# Patient Record
Sex: Female | Born: 1935 | Race: White | Hispanic: No | Marital: Married | Smoking: Never smoker
Health system: Southern US, Community
[De-identification: ages and names within clinical notes are randomized; demographics above are authoritative.]

## PROBLEM LIST (undated history)

## (undated) DIAGNOSIS — R079 Chest pain, unspecified: Secondary | ICD-10-CM

## (undated) DIAGNOSIS — R0609 Other forms of dyspnea: Secondary | ICD-10-CM

## (undated) DIAGNOSIS — M4804 Spinal stenosis, thoracic region: Secondary | ICD-10-CM

## (undated) DIAGNOSIS — M4714 Other spondylosis with myelopathy, thoracic region: Secondary | ICD-10-CM

## (undated) DIAGNOSIS — Z8701 Personal history of pneumonia (recurrent): Secondary | ICD-10-CM

## (undated) DIAGNOSIS — I639 Cerebral infarction, unspecified: Secondary | ICD-10-CM

## (undated) DIAGNOSIS — R0600 Dyspnea, unspecified: Secondary | ICD-10-CM

## (undated) DIAGNOSIS — E785 Hyperlipidemia, unspecified: Secondary | ICD-10-CM

## (undated) DIAGNOSIS — I350 Nonrheumatic aortic (valve) stenosis: Secondary | ICD-10-CM

## (undated) DIAGNOSIS — N189 Chronic kidney disease, unspecified: Secondary | ICD-10-CM

## (undated) DIAGNOSIS — I10 Essential (primary) hypertension: Secondary | ICD-10-CM

## (undated) DIAGNOSIS — N200 Calculus of kidney: Secondary | ICD-10-CM

## (undated) HISTORY — DX: Hyperlipidemia, unspecified: E78.5

## (undated) HISTORY — PX: CATARACT EXTRACTION, BILATERAL: SHX1313

## (undated) HISTORY — DX: Nonrheumatic aortic (valve) stenosis: I35.0

## (undated) HISTORY — PX: TONSILLECTOMY AND ADENOIDECTOMY: SUR1326

## (undated) HISTORY — PX: ENDOMETRIAL ABLATION: SHX621

## (undated) HISTORY — PX: APPENDECTOMY: SHX54

## (undated) HISTORY — DX: Calculus of kidney: N20.0

## (undated) HISTORY — PX: THORACIC FUSION: SHX1062

---

## 1996-02-15 DIAGNOSIS — I639 Cerebral infarction, unspecified: Secondary | ICD-10-CM

## 1996-02-15 HISTORY — DX: Cerebral infarction, unspecified: I63.9

## 1997-07-08 ENCOUNTER — Other Ambulatory Visit: Admission: RE | Admit: 1997-07-08 | Discharge: 1997-07-08 | Payer: Self-pay | Admitting: *Deleted

## 1997-08-26 ENCOUNTER — Ambulatory Visit (HOSPITAL_COMMUNITY): Admission: RE | Admit: 1997-08-26 | Discharge: 1997-08-26 | Payer: Self-pay | Admitting: Obstetrics and Gynecology

## 1997-08-27 ENCOUNTER — Other Ambulatory Visit: Admission: RE | Admit: 1997-08-27 | Discharge: 1997-08-27 | Payer: Self-pay | Admitting: *Deleted

## 1998-05-13 ENCOUNTER — Other Ambulatory Visit: Admission: RE | Admit: 1998-05-13 | Discharge: 1998-05-13 | Payer: Self-pay | Admitting: Obstetrics and Gynecology

## 1999-06-03 ENCOUNTER — Other Ambulatory Visit: Admission: RE | Admit: 1999-06-03 | Discharge: 1999-06-03 | Payer: Self-pay | Admitting: Obstetrics and Gynecology

## 1999-07-20 ENCOUNTER — Ambulatory Visit (HOSPITAL_COMMUNITY): Admission: RE | Admit: 1999-07-20 | Discharge: 1999-07-20 | Payer: Self-pay | Admitting: Obstetrics and Gynecology

## 1999-08-03 ENCOUNTER — Ambulatory Visit (HOSPITAL_COMMUNITY): Admission: RE | Admit: 1999-08-03 | Discharge: 1999-08-03 | Payer: Self-pay | Admitting: Gastroenterology

## 2000-04-27 ENCOUNTER — Ambulatory Visit (HOSPITAL_COMMUNITY): Admission: RE | Admit: 2000-04-27 | Discharge: 2000-04-27 | Payer: Self-pay | Admitting: Neurosurgery

## 2000-04-27 ENCOUNTER — Encounter: Payer: Self-pay | Admitting: Neurosurgery

## 2000-05-11 ENCOUNTER — Encounter: Payer: Self-pay | Admitting: Neurosurgery

## 2000-05-11 ENCOUNTER — Ambulatory Visit (HOSPITAL_COMMUNITY): Admission: RE | Admit: 2000-05-11 | Discharge: 2000-05-11 | Payer: Self-pay | Admitting: Neurosurgery

## 2000-05-24 ENCOUNTER — Encounter: Payer: Self-pay | Admitting: Neurosurgery

## 2000-05-24 ENCOUNTER — Encounter: Admission: RE | Admit: 2000-05-24 | Discharge: 2000-05-24 | Payer: Self-pay | Admitting: Neurosurgery

## 2000-07-03 ENCOUNTER — Other Ambulatory Visit: Admission: RE | Admit: 2000-07-03 | Discharge: 2000-07-03 | Payer: Self-pay | Admitting: Obstetrics and Gynecology

## 2000-10-23 ENCOUNTER — Ambulatory Visit (HOSPITAL_COMMUNITY): Admission: RE | Admit: 2000-10-23 | Discharge: 2000-10-23 | Payer: Self-pay | Admitting: Neurosurgery

## 2000-10-31 ENCOUNTER — Encounter: Payer: Self-pay | Admitting: Neurosurgery

## 2000-11-02 ENCOUNTER — Encounter: Payer: Self-pay | Admitting: Neurosurgery

## 2000-11-02 ENCOUNTER — Inpatient Hospital Stay (HOSPITAL_COMMUNITY): Admission: RE | Admit: 2000-11-02 | Discharge: 2000-11-07 | Payer: Self-pay | Admitting: Neurosurgery

## 2000-12-20 ENCOUNTER — Ambulatory Visit (HOSPITAL_COMMUNITY): Admission: RE | Admit: 2000-12-20 | Discharge: 2000-12-20 | Payer: Self-pay | Admitting: Neurosurgery

## 2001-01-05 ENCOUNTER — Inpatient Hospital Stay (HOSPITAL_COMMUNITY): Admission: RE | Admit: 2001-01-05 | Discharge: 2001-01-06 | Payer: Self-pay | Admitting: Neurosurgery

## 2001-01-05 ENCOUNTER — Encounter: Payer: Self-pay | Admitting: Neurosurgery

## 2001-02-05 ENCOUNTER — Encounter: Admission: RE | Admit: 2001-02-05 | Discharge: 2001-02-05 | Payer: Self-pay | Admitting: Neurosurgery

## 2001-02-05 ENCOUNTER — Encounter: Payer: Self-pay | Admitting: Neurosurgery

## 2001-05-01 ENCOUNTER — Encounter: Payer: Self-pay | Admitting: Neurosurgery

## 2001-05-01 ENCOUNTER — Encounter: Admission: RE | Admit: 2001-05-01 | Discharge: 2001-05-01 | Payer: Self-pay | Admitting: Neurosurgery

## 2001-08-28 ENCOUNTER — Encounter: Payer: Self-pay | Admitting: Neurosurgery

## 2001-08-28 ENCOUNTER — Ambulatory Visit (HOSPITAL_COMMUNITY): Admission: RE | Admit: 2001-08-28 | Discharge: 2001-08-28 | Payer: Self-pay | Admitting: Neurosurgery

## 2001-10-08 ENCOUNTER — Inpatient Hospital Stay (HOSPITAL_COMMUNITY): Admission: RE | Admit: 2001-10-08 | Discharge: 2001-10-13 | Payer: Self-pay | Admitting: Neurosurgery

## 2001-10-08 ENCOUNTER — Encounter: Payer: Self-pay | Admitting: Neurosurgery

## 2001-10-09 ENCOUNTER — Encounter: Payer: Self-pay | Admitting: Neurosurgery

## 2002-12-26 ENCOUNTER — Other Ambulatory Visit: Admission: RE | Admit: 2002-12-26 | Discharge: 2002-12-26 | Payer: Self-pay | Admitting: Internal Medicine

## 2004-03-30 ENCOUNTER — Ambulatory Visit (HOSPITAL_COMMUNITY): Admission: RE | Admit: 2004-03-30 | Discharge: 2004-03-30 | Payer: Self-pay | Admitting: Internal Medicine

## 2005-02-01 ENCOUNTER — Ambulatory Visit (HOSPITAL_COMMUNITY): Admission: RE | Admit: 2005-02-01 | Discharge: 2005-02-01 | Payer: Self-pay | Admitting: Gastroenterology

## 2005-03-24 ENCOUNTER — Other Ambulatory Visit: Admission: RE | Admit: 2005-03-24 | Discharge: 2005-03-24 | Payer: Self-pay | Admitting: Internal Medicine

## 2005-07-05 ENCOUNTER — Ambulatory Visit (HOSPITAL_COMMUNITY): Admission: RE | Admit: 2005-07-05 | Discharge: 2005-07-05 | Payer: Self-pay | Admitting: Neurosurgery

## 2005-07-25 ENCOUNTER — Ambulatory Visit (HOSPITAL_COMMUNITY): Admission: RE | Admit: 2005-07-25 | Discharge: 2005-07-25 | Payer: Self-pay | Admitting: Neurosurgery

## 2005-08-03 ENCOUNTER — Ambulatory Visit: Admission: RE | Admit: 2005-08-03 | Discharge: 2005-08-03 | Payer: Self-pay | Admitting: Neurosurgery

## 2005-08-10 ENCOUNTER — Ambulatory Visit (HOSPITAL_COMMUNITY): Admission: RE | Admit: 2005-08-10 | Discharge: 2005-08-10 | Payer: Self-pay | Admitting: Neurosurgery

## 2005-09-01 ENCOUNTER — Inpatient Hospital Stay (HOSPITAL_COMMUNITY): Admission: RE | Admit: 2005-09-01 | Discharge: 2005-09-06 | Payer: Self-pay | Admitting: Neurosurgery

## 2008-04-06 IMAGING — CT CT L SPINE W/O CM
3 series · 16 of 33 positions shown, 19 images · IV contrast (agent unspecified)
Comparison: none

CLINICAL DATA: Back pain, History of lumbar fusion with laminectomy.
LUMBAR SPINE CT WITHOUT CONTRAST:
TECHNIQUE: Multidetector CT imaging of the lumbar spine was performed.  Multiplanar CT image reconstructions were also generated.

[Series 4: 2mm axial soft tissue · axial · 0.39mm/px · z∈[+786,+1028]mm · 8 of 143 slices shown, 10 images]
[im 11/143  soft-tissue]
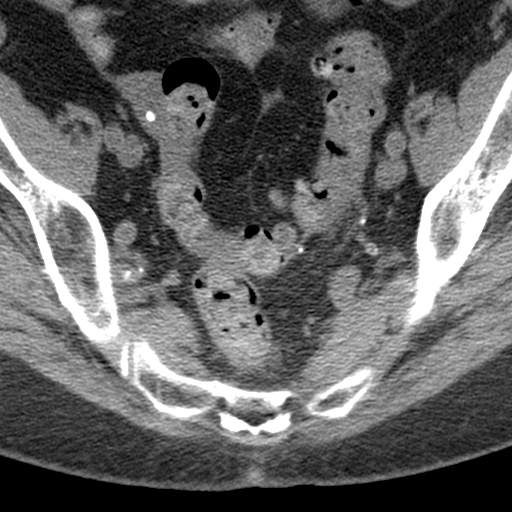
[im 11/143  bone]
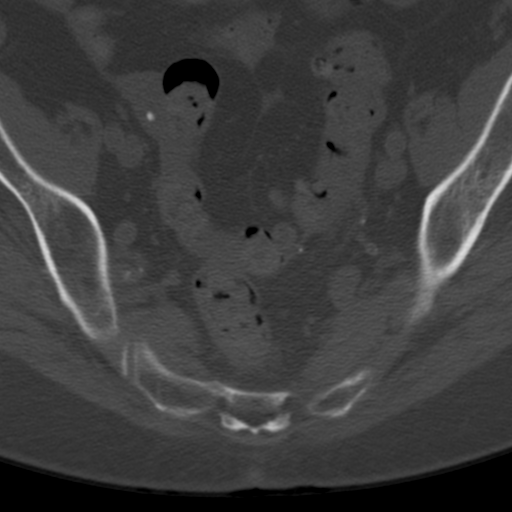
[im 33/143  bone]
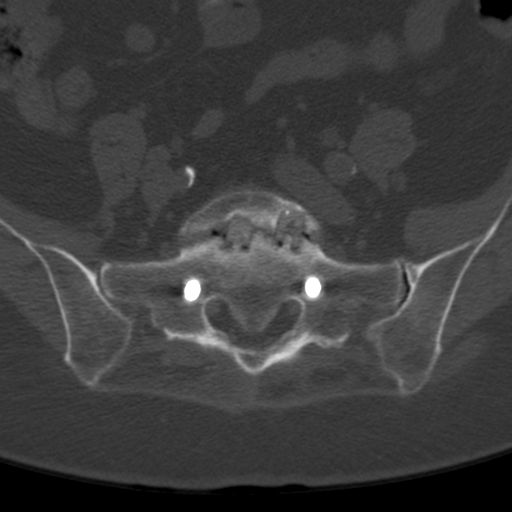
[im 44/143  bone]
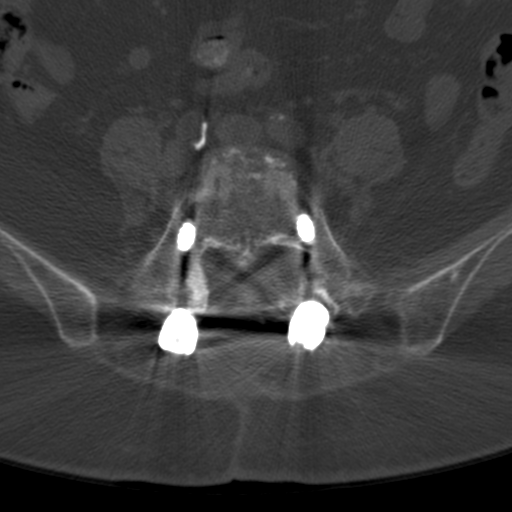
[im 66/143  bone]
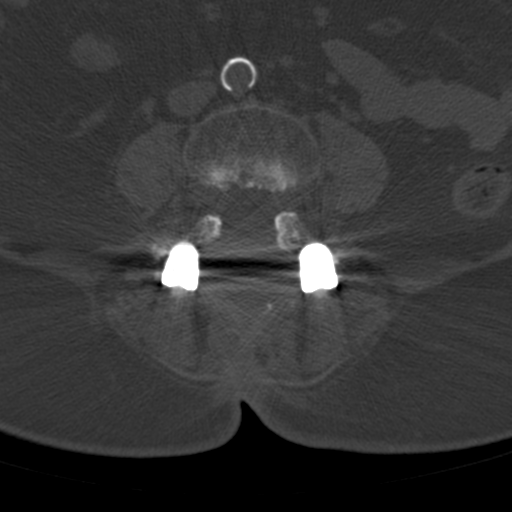
[im 77/143  soft-tissue]
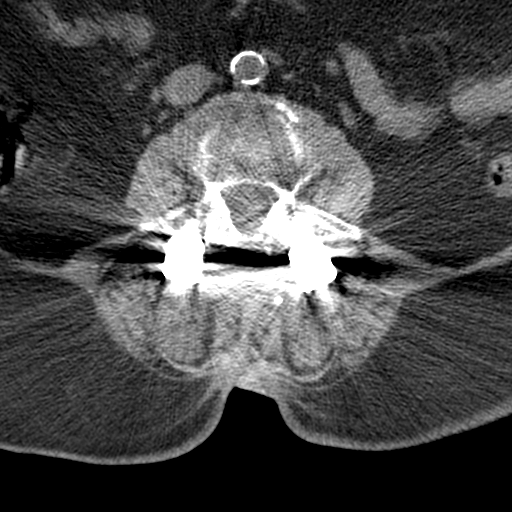
[im 77/143  bone]
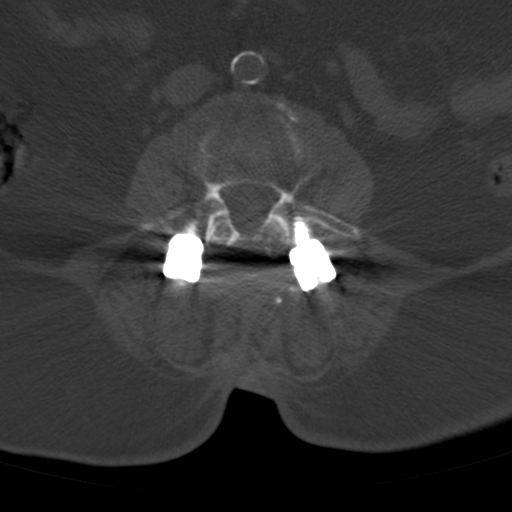
[im 99/143  bone]
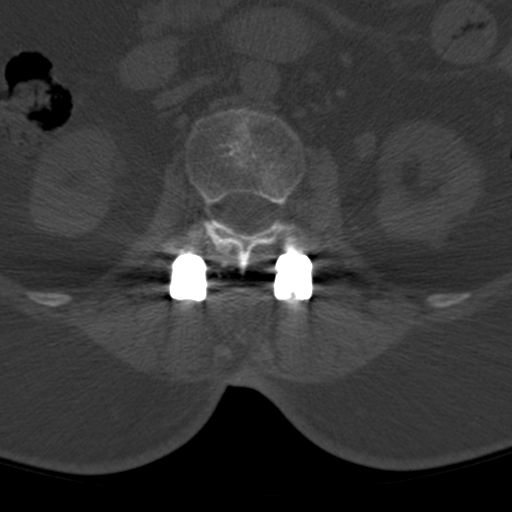
[im 110/143  bone]
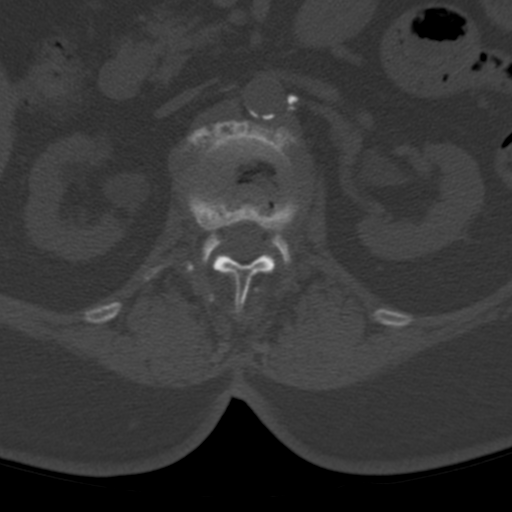
[im 132/143  bone]
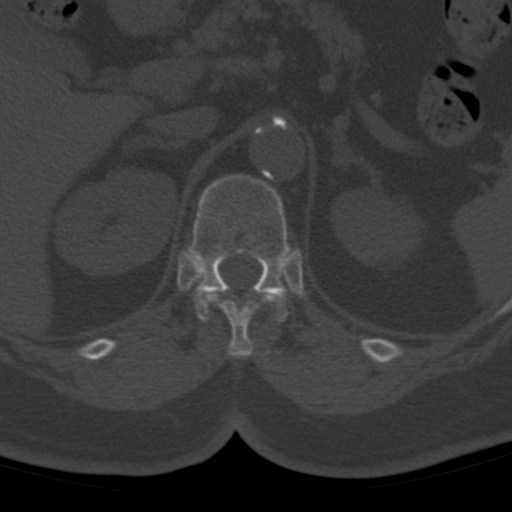

[Series 602: coronals · coronal · 0.56mm/px · 3 of 71 slices shown]
[im 15/71  bone]
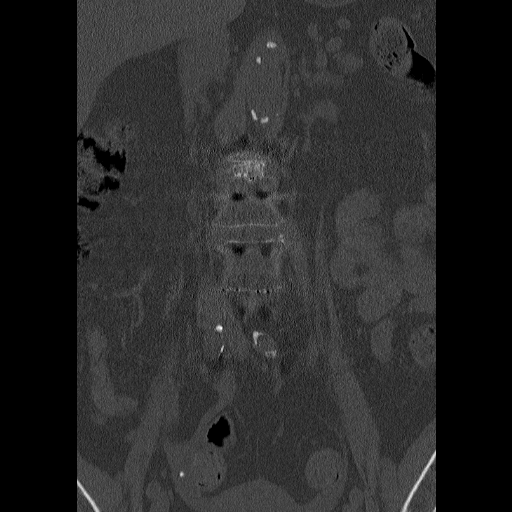
[im 29/71  bone]
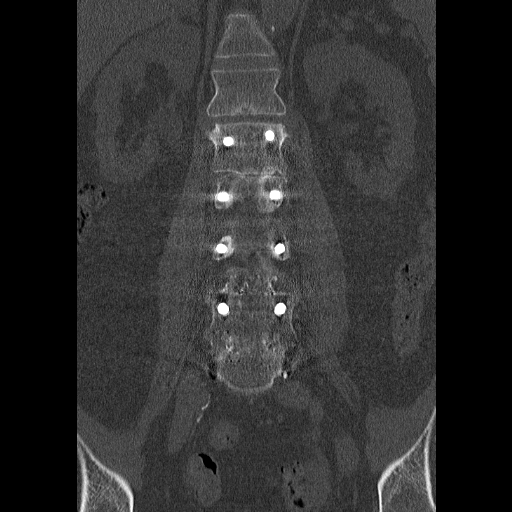
[im 43/71  bone]
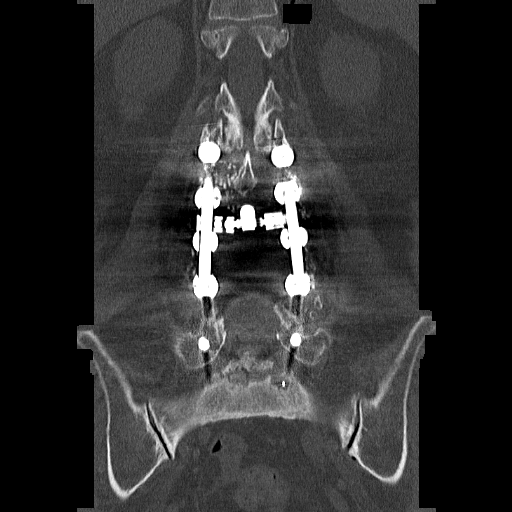

[Series 603: sagittals · sagittal · 0.56mm/px · 5 of 71 slices shown, 6 images]
[im 24/71  bone]
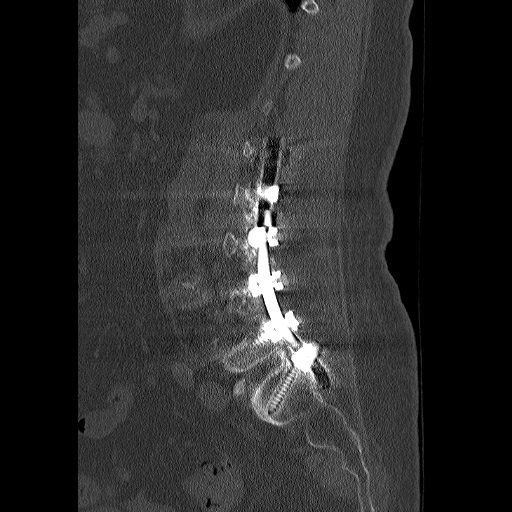
[im 30/71  bone]
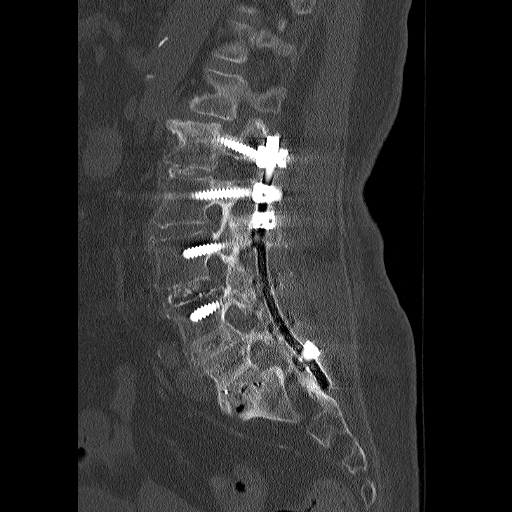
[im 36/71  soft-tissue]
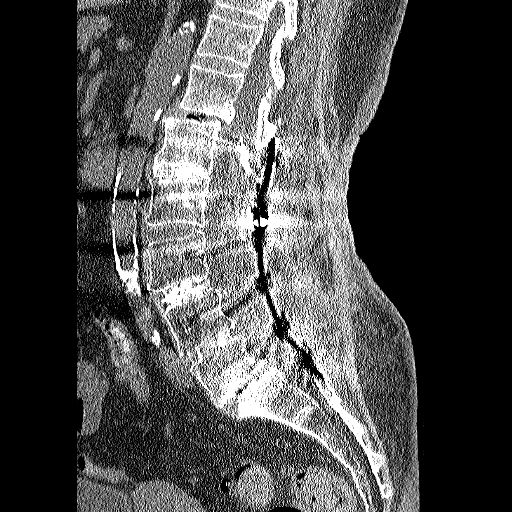
[im 36/71  bone]
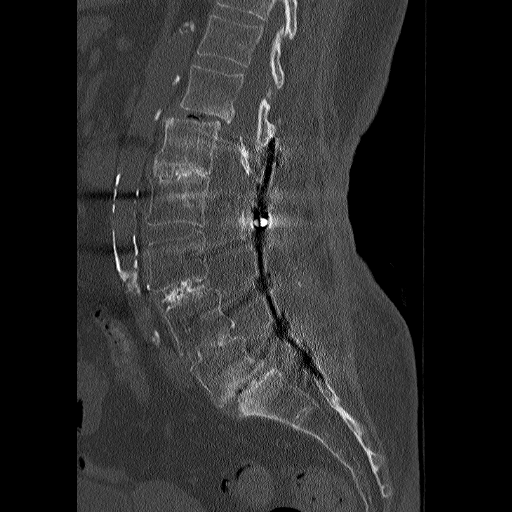
[im 41/71  bone]
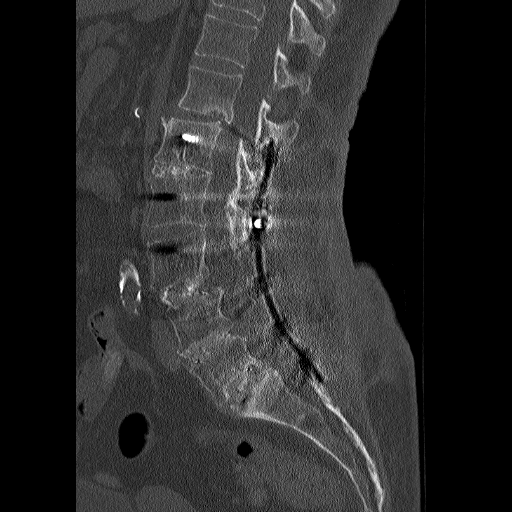
[im 47/71  bone]
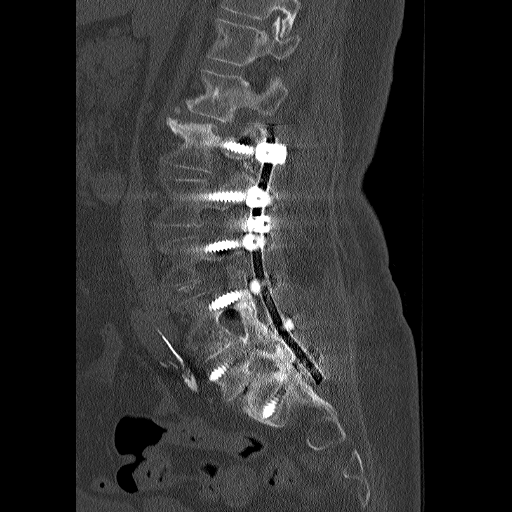

[16 of 33 positions shown; findings below may reference images not displayed]

FINDINGS: Scans were obtained without IV contrast from the T10-11 level through the L5-S1 level. Sagittal and coronal reformations were performed.  The study is correlated with an MR of the lumbar spine dated 07/05/05.
IMPRESSION: There has been previous wide posterior laminectomy from the L-1 level inferiorly through the L5-S1 level.  There are pedicle screws noted bilaterally from the L-1 through S-1 levels. The pedicle screws appear intact and in satisfactory position and the posterior rods and hardware appear intact.  There has been interbody fusions performed at the L1-2, L3-4, L4-5 and L5-S1 levels.  There is no interbody fusion at the L2-3 level.  There are streak artifacts related to the metal. However, the fusion appears stable.  There is retrolisthesis of T-12 on L-1 which measures 5 mm by CT.  There is a vacuum phenomenon present at this level and there are bilateral facet joint arthritic changes noted at this level.  Although the thecal sac is not well evaluated on this CT scan, the previous MR did demonstrate canal narrowing with effacement of the subarachnoid space.  Also the posterior fluid collection described on the MR scan extending from L-3 through L-5 is not well evaluated by CT due to the extreme streak artifacts.  There are no bony destructive changes.
IMPRESSION: Bilateral laminectomy from the L1 through S-1 levels with posterior rods and pedicle screws with intact hardware and stable appearance of the fusion. There is marked degenerative disk space narrowing at the T12-L1 level with 5 mm retrolisthesis of T-12 on L-1.

## 2010-02-14 HISTORY — PX: LITHOTRIPSY: SUR834

## 2010-08-06 ENCOUNTER — Encounter (HOSPITAL_COMMUNITY): Payer: Self-pay

## 2010-08-06 ENCOUNTER — Emergency Department (HOSPITAL_COMMUNITY)
Admission: EM | Admit: 2010-08-06 | Discharge: 2010-08-06 | Disposition: A | Discharge status: Home / Self Care | Payer: Medicare Other | Attending: Emergency Medicine | Admitting: Emergency Medicine

## 2010-08-06 ENCOUNTER — Emergency Department (HOSPITAL_COMMUNITY): Payer: Medicare Other

## 2010-08-06 DIAGNOSIS — R1031 Right lower quadrant pain: Secondary | ICD-10-CM | POA: Insufficient documentation

## 2010-08-06 DIAGNOSIS — R109 Unspecified abdominal pain: Secondary | ICD-10-CM | POA: Insufficient documentation

## 2010-08-06 DIAGNOSIS — I10 Essential (primary) hypertension: Secondary | ICD-10-CM | POA: Insufficient documentation

## 2010-08-06 DIAGNOSIS — N200 Calculus of kidney: Secondary | ICD-10-CM | POA: Insufficient documentation

## 2010-08-06 LAB — URINALYSIS, ROUTINE W REFLEX MICROSCOPIC
Bilirubin Urine: NEGATIVE
Glucose, UA: NEGATIVE mg/dL
Ketones, ur: NEGATIVE mg/dL
Nitrite: NEGATIVE
Protein, ur: NEGATIVE mg/dL
Specific Gravity, Urine: 1.019 (ref 1.005–1.030)
Urobilinogen, UA: 0.2 mg/dL (ref 0.0–1.0)

## 2010-08-06 LAB — DIFFERENTIAL
Basophils Relative: 0 % (ref 0–1)
Eosinophils Absolute: 0.3 10*3/uL (ref 0.0–0.7)
Eosinophils Relative: 3 % (ref 0–5)
Monocytes Absolute: 1 10*3/uL (ref 0.1–1.0)
Monocytes Relative: 11 % (ref 3–12)
Neutro Abs: 6.2 10*3/uL (ref 1.7–7.7)

## 2010-08-06 LAB — CBC
MCH: 28.5 pg (ref 26.0–34.0)
MCHC: 34 g/dL (ref 30.0–36.0)
Platelets: 202 10*3/uL (ref 150–400)

## 2010-08-06 LAB — POCT I-STAT, CHEM 8
Chloride: 108 mEq/L (ref 96–112)
Glucose, Bld: 120 mg/dL — ABNORMAL HIGH (ref 70–99)
Hemoglobin: 12.2 g/dL (ref 12.0–15.0)
Potassium: 4 mEq/L (ref 3.5–5.1)
Sodium: 137 mEq/L (ref 135–145)

## 2010-08-06 LAB — URINE MICROSCOPIC-ADD ON

## 2010-08-10 ENCOUNTER — Ambulatory Visit (HOSPITAL_BASED_OUTPATIENT_CLINIC_OR_DEPARTMENT_OTHER)
Admission: RE | Admit: 2010-08-10 | Discharge: 2010-08-10 | Disposition: A | Discharge status: Home / Self Care | Payer: Medicare Other | Source: Ambulatory Visit | Attending: Urology | Admitting: Urology

## 2010-08-10 DIAGNOSIS — N201 Calculus of ureter: Secondary | ICD-10-CM | POA: Insufficient documentation

## 2010-08-10 DIAGNOSIS — Z01810 Encounter for preprocedural cardiovascular examination: Secondary | ICD-10-CM | POA: Insufficient documentation

## 2010-08-10 DIAGNOSIS — R9431 Abnormal electrocardiogram [ECG] [EKG]: Secondary | ICD-10-CM | POA: Insufficient documentation

## 2010-08-10 DIAGNOSIS — Z01812 Encounter for preprocedural laboratory examination: Secondary | ICD-10-CM | POA: Insufficient documentation

## 2010-08-10 LAB — POCT I-STAT, CHEM 8: HCT: 35 % — ABNORMAL LOW (ref 36.0–46.0)

## 2010-08-11 LAB — URINE CULTURE
Culture  Setup Time: 201206262110
Culture: NO GROWTH

## 2010-09-09 NOTE — Op Note (Signed)
NAMELEOMA, FOLDS           ACCOUNT NO.:  [id number]  MEDICAL RECORD NO.:  [id number]  LOCATION:                                 FACILITY:  PHYSICIAN:  Danae Chen, M.D.  DATE OF BIRTH:  1924/04/12  DATE OF PROCEDURE:  08/10/2010 DATE OF DISCHARGE:                              OPERATIVE REPORT   PREOPERATIVE DIAGNOSIS:  Right ureteral stone.  POSTOPERATIVE DIAGNOSIS:  Right ureteral stone.  PROCEDURE DONE:  Cystoscopy, right retrograde pyelogram, ureteroscopy, holmium laser right ureteral stone, stone extraction, and insertion of double-J stent.  SURGEON:  Danae Chen, MD  ANESTHESIA:  General.  INDICATION:  The patient is an 77 year old female who has been complaining of right flank and right lower quadrant pain on and off for the past 2 weeks.  She was at first treated by her primary care physician for urinary tract infection and pain became severe and a CT scan showed a 6 mm stone in the right distal ureter.  She was treated conservatively with medical expulsive therapy, she has not passed the stone, and she is still complaining of pain.  She is scheduled today for cystoscopy, right retrograde pyelogram, ureteroscopy, holmium laser of ureteral stone, and insertion of double-J stent.  The patient was identified by her wristband and proper time-out was taken.  Under general anesthesia, she was prepped and draped and placed in the dorsal lithotomy position.  A panendoscope was inserted in the bladder. The bladder mucosa was normal.  There was no stone or tumor in the bladder.  The ureteral orifices were in normal position and shape.  Retrograde pyelogram.  A sensor wire was passed through an open-ended catheter and the sensor wire and open-ended catheter were passed through the cystoscope and through the right ureteral orifice.  The sensor wire was removed.  Contrast was then injected through the open-ended catheter.  About 10 cc of contrast were instilled  through the open-ended catheter.  There was a filling defect in the distal ureter about 2 cm above the ureteral orifice.  The ureter proximal to the filling defect was moderately dilated.  The sensor wire was then passed through the open-ended catheter and advanced up to the renal pelvis.  The sensor wire was left in place as a safety wire.  The open-ended catheter was removed.  A semi-rigid ureteroscope was then passed in the bladder, but could not be passed through the ureteral orifice.  The ureteroscope was removed. The intramural ureter was then dilated with the ureteroscope access sheath and the ureteroscope access sheath was removed.  A semi-rigid ureteroscope was then passed without difficulty through the right ureteral orifice.  The stone was seen in the distal ureter.  With the holmium laser at a setting of 0.5 joules and 22 shocks, the stone was fragmented in multiple smaller fragments.  The stone fragments were removed with a nitinol basket and dropped in the bladder.  There was no remaining stone fragment in the ureter at the end of the procedure.  Second retrograde pyelogram.  Contrast was then injected through the ureteroscope and there was no evidence of filling defect in the ureter and there was no extravasation of contrast.  The ureteroscope  was removed.  The sensor wire was then back loaded into the cystoscope.  A #[id number] double-J stent was passed over the guidewire and the guidewire was removed.  The proximal curl of the double-J stent was in the renal pelvis, the distal curl was in the bladder.  Then, the stone fragments were irrigated out of the bladder with the [facility] syringe. There was no remaining fragment in the bladder.  The bladder was then emptied and the cystoscope removed.  The patient tolerated the procedure well and left the OR in satisfactory condition to post-anesthesia care unit.     Danae Chen, M.D.     MN/MEDQ  D:  08/10/2010  T:   08/10/2010  Job:  [id number]  Electronically Signed by Lindaann Slough M.D. on 09/09/2010 05:52:44 PM

## 2010-11-08 ENCOUNTER — Encounter (HOSPITAL_BASED_OUTPATIENT_CLINIC_OR_DEPARTMENT_OTHER)
Admission: RE | Admit: 2010-11-08 | Discharge: 2010-11-08 | Disposition: A | Discharge status: Home / Self Care | Payer: Medicare Other | Source: Ambulatory Visit | Attending: Orthopedic Surgery | Admitting: Orthopedic Surgery

## 2010-11-08 LAB — BASIC METABOLIC PANEL
BUN: 22 mg/dL (ref 6–23)
Calcium: 9.6 mg/dL (ref 8.4–10.5)
Chloride: 106 mEq/L (ref 96–112)
Creatinine, Ser: 1.02 mg/dL (ref 0.50–1.10)
Potassium: 4.1 mEq/L (ref 3.5–5.1)

## 2010-11-09 ENCOUNTER — Ambulatory Visit (HOSPITAL_BASED_OUTPATIENT_CLINIC_OR_DEPARTMENT_OTHER)
Admission: RE | Admit: 2010-11-09 | Discharge: 2010-11-10 | Disposition: A | Discharge status: Home / Self Care | Payer: Medicare Other | Source: Ambulatory Visit | Attending: Orthopedic Surgery | Admitting: Orthopedic Surgery

## 2010-11-09 DIAGNOSIS — I10 Essential (primary) hypertension: Secondary | ICD-10-CM | POA: Insufficient documentation

## 2010-11-09 DIAGNOSIS — W19XXXA Unspecified fall, initial encounter: Secondary | ICD-10-CM | POA: Insufficient documentation

## 2010-11-09 DIAGNOSIS — Y929 Unspecified place or not applicable: Secondary | ICD-10-CM | POA: Insufficient documentation

## 2010-11-09 DIAGNOSIS — S52599A Other fractures of lower end of unspecified radius, initial encounter for closed fracture: Secondary | ICD-10-CM | POA: Insufficient documentation

## 2010-11-09 DIAGNOSIS — Z01812 Encounter for preprocedural laboratory examination: Secondary | ICD-10-CM | POA: Insufficient documentation

## 2010-11-09 LAB — POCT HEMOGLOBIN-HEMACUE: Hemoglobin: 11.9 g/dL — ABNORMAL LOW (ref 12.0–15.0)

## 2010-11-11 NOTE — Op Note (Signed)
[name], [name]           ACCOUNT NO.:  [id number]  MEDICAL RECORD NO.:  [id number]  LOCATION:                                 FACILITY:  PHYSICIAN:  Suzanne Fitch. Hannia Matchett, M.D. DATE OF BIRTH:  Jul 18, 1924  DATE OF PROCEDURE:  11/09/2010 DATE OF DISCHARGE:                              OPERATIVE REPORT   PREOPERATIVE DIAGNOSIS:  Chauffeur's type fracture, right radius, with uneven scaphoid facet of distal radius, rule out lesser arc injury surrounding perilunate articulation.  POSTOPERATIVE DIAGNOSIS:  Unstable chauffeur's fracture, right distal radius, with possible scapholunate ligament partial injury.  OPERATION:  Open reduction and internal fixation of chauffeur's type fracture of right distal radius utilizing a 6-peg DVR volar plate system with fluoroscopic evaluation of wrist demonstrating some widening of the scapholunate interosseous space.  OPERATING SURGEON:  Suzanne Fitch. Vihana Kydd, MD  ASSISTANT:  Marveen Reeks Dasnoit, PA-C  ANESTHESIA:  General by LMA.  SUPERVISING ANESTHESIOLOGIST:  Quita Skye. 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  She sustained a displaced articular fracture of the radius that extended into the styloid and scaphoid facet and might be involved with a lesser arc injury of the perilunate joints.  Suzanne Howard was seen at the [facility] where she was splinted and referred for orthopedic followup.  Clinical examination revealed an unstable chauffeur's type fracture.  There was some widening of the scapholunate interval.  We advised her to proceed with open reduction and internal fixation of the fracture utilizing the volar plate system, and we will examine her scapholunate ligament at the time of surgery.  At age 78, we will not entertain repair of the scapholunate ligament.  Questions regarding her surgery were invited and answered in detail preoperatively.  She was  interviewed by Dr. Krista Blue of Anesthesia.  After informed consent, Dr. Krista Blue placed an ultrasound-guided plexus block. Satisfactory anesthesia of the right upper extremity was achieved.  PROCEDURE:  Suzanne Howard was brought to room 6 of the [facility] and placed in a supine position upon the operating table.  Following the induction of general anesthesia by LMA technique, her arm was prepped with Betadine soap and solution and sterilely draped.  Per protocol, 2 g of Ancef were administered as IV prophylactic antibiotic.  Procedure commenced with a routine surgical time-out followed by planning of a standard DVR volar incision.  The incision was taken sharply followed by electrocautery of subcutaneous veins.  The flexor carpi radialis tendon was identified, fascia released superficial and deep to the tendon followed by elevation and ulnar retraction of the flexor pollicis longus and insertion of the pronator quadratus.  The pronator quadratus was elevated revealing the displaced fracture site.  There was a Laurence Compton type fracture equivalent with palmar displacement of the articular surface of the distal radius. There was an unstable step off the volar cortex.  The fracture was molded with three-point manual molding, reduced anatomically followed by meticulous application of 6-peg DVR plate system.  Suzanne Howard had an atypical shape to her radius that required meticulous bending of the plate with plate benders to accommodate the shape of volar radius.  Ultimately, an excellent  fit was achieved.  The plate was placed with a 14, 12, and 12 mm 3.5 cortical screw set followed by placement of 6 distal pegs controlling length fluoroscopically and by direct palpation of the dorsal cortex.  Anatomic reduction of the radius was achieved.  Examination of the scapholunate interval revealed widening.  There was no frank instability of the scaphoid with wrist motion; therefore, no  further intervention will be entertained at the carpal level.  The wound was thoroughly irrigated with sterile saline followed by repair of the distal pronator quadratus to cover the plate and protect the flexor tendons.  The wound was irrigated a second time followed by repair of the skin with subcutaneous sutures of 3-0 Vicryl and intradermal 3-0 Prolene segmental suture limbs.  Steri-Strips were applied followed by application of a voluminous gauze and Webril dressing.  A very light sugar-tong splint was applied for postoperative comfort.  For aftercare, Suzanne Howard is provided prescriptions for Percocet 5 mg one p.o. q.4-6 h. p.r.n. pain, 30 tablets without refill, also Keflex 500 mg one p.o. q.8 h. x4 days as a prophylactic antibiotic.  We will see her back in followup in our office in a week for dressing change, x-ray, application of a Velcro splint, and initiation of range of motion excises to the wrist, fingers, and thumb.     Suzanne Howard, M.D.     RVS/MEDQ  D:  11/09/2010  T:  11/09/2010  Job:  [id number]  Electronically Signed by Josephine Igo M.D. on 11/11/2010 08:16:36 AM

## 2011-02-12 ENCOUNTER — Ambulatory Visit (INDEPENDENT_AMBULATORY_CARE_PROVIDER_SITE_OTHER): Payer: Medicare Other

## 2011-02-12 DIAGNOSIS — E538 Deficiency of other specified B group vitamins: Secondary | ICD-10-CM

## 2011-02-12 DIAGNOSIS — R040 Epistaxis: Secondary | ICD-10-CM

## 2011-02-12 DIAGNOSIS — R042 Hemoptysis: Secondary | ICD-10-CM

## 2011-02-12 DIAGNOSIS — J301 Allergic rhinitis due to pollen: Secondary | ICD-10-CM

## 2011-02-12 DIAGNOSIS — J029 Acute pharyngitis, unspecified: Secondary | ICD-10-CM

## 2011-02-12 DIAGNOSIS — J019 Acute sinusitis, unspecified: Secondary | ICD-10-CM

## 2011-04-07 ENCOUNTER — Other Ambulatory Visit: Payer: Self-pay | Admitting: Neurosurgery

## 2011-04-07 DIAGNOSIS — M545 Low back pain: Secondary | ICD-10-CM

## 2011-04-07 DIAGNOSIS — M546 Pain in thoracic spine: Secondary | ICD-10-CM

## 2011-04-07 DIAGNOSIS — IMO0002 Reserved for concepts with insufficient information to code with codable children: Secondary | ICD-10-CM

## 2011-04-12 ENCOUNTER — Other Ambulatory Visit: Payer: Medicare Other

## 2011-04-14 ENCOUNTER — Ambulatory Visit
Admission: RE | Admit: 2011-04-14 | Discharge: 2011-04-14 | Disposition: A | Discharge status: Home / Self Care | Payer: Medicare Other | Source: Ambulatory Visit | Attending: Neurosurgery | Admitting: Neurosurgery

## 2011-04-14 DIAGNOSIS — IMO0002 Reserved for concepts with insufficient information to code with codable children: Secondary | ICD-10-CM

## 2011-04-14 DIAGNOSIS — M546 Pain in thoracic spine: Secondary | ICD-10-CM

## 2011-04-14 DIAGNOSIS — M545 Low back pain: Secondary | ICD-10-CM

## 2011-04-14 MED ORDER — GADOBENATE DIMEGLUMINE 529 MG/ML IV SOLN
7.0000 mL | Freq: Once | INTRAVENOUS | Status: AC | PRN
  Administered 2011-04-14: 7 mL via INTRAVENOUS

## 2011-04-26 ENCOUNTER — Other Ambulatory Visit: Payer: Self-pay | Admitting: Neurosurgery

## 2011-04-27 ENCOUNTER — Encounter (HOSPITAL_COMMUNITY): Payer: Self-pay | Admitting: Pharmacy Technician

## 2011-04-29 ENCOUNTER — Encounter (HOSPITAL_COMMUNITY)
Admission: RE | Admit: 2011-04-29 | Discharge: 2011-04-29 | Disposition: A | Discharge status: Home / Self Care | Payer: Medicare Other | Source: Ambulatory Visit | Attending: Neurosurgery | Admitting: Neurosurgery

## 2011-04-29 ENCOUNTER — Encounter (HOSPITAL_COMMUNITY): Payer: Self-pay

## 2011-04-29 ENCOUNTER — Ambulatory Visit (HOSPITAL_COMMUNITY)
Admission: RE | Admit: 2011-04-29 | Discharge: 2011-04-29 | Disposition: A | Discharge status: Home / Self Care | Payer: Medicare Other | Source: Ambulatory Visit | Attending: Anesthesiology | Admitting: Anesthesiology

## 2011-04-29 DIAGNOSIS — Z01818 Encounter for other preprocedural examination: Secondary | ICD-10-CM | POA: Insufficient documentation

## 2011-04-29 HISTORY — DX: Spinal stenosis, thoracic region: M48.04

## 2011-04-29 HISTORY — DX: Essential (primary) hypertension: I10

## 2011-04-29 HISTORY — DX: Other spondylosis with myelopathy, thoracic region: M47.14

## 2011-04-29 HISTORY — DX: Cerebral infarction, unspecified: I63.9

## 2011-04-29 HISTORY — DX: Personal history of pneumonia (recurrent): Z87.01

## 2011-04-29 LAB — BASIC METABOLIC PANEL
CO2: 25 mEq/L (ref 19–32)
Calcium: 9.4 mg/dL (ref 8.4–10.5)
Chloride: 103 mEq/L (ref 96–112)
Glucose, Bld: 102 mg/dL — ABNORMAL HIGH (ref 70–99)
Potassium: 4.1 mEq/L (ref 3.5–5.1)
Sodium: 139 mEq/L (ref 135–145)

## 2011-04-29 LAB — DIFFERENTIAL
Eosinophils Absolute: 0.2 10*3/uL (ref 0.0–0.7)
Eosinophils Relative: 3 % (ref 0–5)
Lymphocytes Relative: 34 % (ref 12–46)
Lymphs Abs: 1.9 10*3/uL (ref 0.7–4.0)
Monocytes Absolute: 0.7 10*3/uL (ref 0.1–1.0)
Monocytes Relative: 13 % — ABNORMAL HIGH (ref 3–12)

## 2011-04-29 LAB — CBC
HCT: 39.2 % (ref 36.0–46.0)
Hemoglobin: 12.8 g/dL (ref 12.0–15.0)
MCH: 27.5 pg (ref 26.0–34.0)
MCV: 84.3 fL (ref 78.0–100.0)
RBC: 4.65 MIL/uL (ref 3.87–5.11)
WBC: 5.5 10*3/uL (ref 4.0–10.5)

## 2011-04-29 LAB — TYPE AND SCREEN

## 2011-04-29 LAB — URINALYSIS, ROUTINE W REFLEX MICROSCOPIC
Glucose, UA: NEGATIVE mg/dL
Hgb urine dipstick: NEGATIVE
Ketones, ur: NEGATIVE mg/dL
Protein, ur: NEGATIVE mg/dL
pH: 5.5 (ref 5.0–8.0)

## 2011-04-29 LAB — SURGICAL PCR SCREEN: Staphylococcus aureus: NEGATIVE

## 2011-04-29 LAB — URINE MICROSCOPIC-ADD ON

## 2011-04-29 NOTE — Pre-Procedure Instructions (Signed)
20 JAMYE BALICKI  04/29/2011   Your procedure is scheduled on:  May 03, 2011  Report to [facility] Stay [facility] at 0530 AM.  Call this number if you have problems the morning of surgery: [phone]   Remember:   Do not eat food:After Midnight.  May have clear liquids: up to 4 Hours before arrival.  Clear liquids include soda, tea, black coffee, apple or grape juice, broth.  Take these medicines the morning of surgery with A SIP OF WATER: Metoprolol   Stop Plavix today, CO Q 10, Ibuprofen, naproxen  Do not wear jewelry, make-up or nail polish.  Do not wear lotions, powders, or perfumes. You may wear deodorant.  Do not shave 48 hours prior to surgery.  Do not bring valuables to the hospital.  Contacts, dentures or bridgework may not be worn into surgery.  Leave suitcase in the car. After surgery it may be brought to your room.  For patients admitted to the hospital, checkout time is 11:00 AM the day of discharge.   Patients discharged the day of surgery will not be allowed to drive home.    Special Instructions: CHG Shower Use Special Wash: 1/2 bottle night before surgery and 1/2 bottle morning of surgery.   Please read over the following fact sheets that you were given: Pain Booklet, Coughing and Deep Breathing, MRSA Information and Surgical Site Infection Prevention

## 2011-05-02 MED ORDER — CEFAZOLIN SODIUM 1-5 GM-% IV SOLN: 1.0000 g | INTRAVENOUS | Status: DC

## 2011-05-02 MED ORDER — CEFAZOLIN SODIUM-DEXTROSE 2-3 GM-% IV SOLR
2.0000 g | INTRAVENOUS | Status: AC
  Administered 2011-05-03: 2 g via INTRAVENOUS
  Filled 2011-05-02: qty 50

## 2011-05-03 ENCOUNTER — Encounter (HOSPITAL_COMMUNITY): Payer: Self-pay | Admitting: Anesthesiology

## 2011-05-03 ENCOUNTER — Encounter (HOSPITAL_COMMUNITY): Payer: Self-pay | Admitting: General Practice

## 2011-05-03 ENCOUNTER — Encounter (HOSPITAL_COMMUNITY): Payer: Self-pay | Admitting: Surgery

## 2011-05-03 ENCOUNTER — Inpatient Hospital Stay (HOSPITAL_COMMUNITY): Payer: Medicare Other

## 2011-05-03 ENCOUNTER — Encounter (HOSPITAL_COMMUNITY)
Admission: RE | Disposition: A | Discharge status: Home Health | Payer: Self-pay | Source: Ambulatory Visit | Attending: Neurosurgery

## 2011-05-03 ENCOUNTER — Inpatient Hospital Stay (HOSPITAL_COMMUNITY): Payer: Medicare Other | Admitting: Anesthesiology

## 2011-05-03 ENCOUNTER — Inpatient Hospital Stay (HOSPITAL_COMMUNITY)
Admission: RE | Admit: 2011-05-03 | Discharge: 2011-05-09 | DRG: 460 | Disposition: A | Discharge status: Home Health | Payer: Medicare Other | Source: Ambulatory Visit | Attending: Neurosurgery | Admitting: Neurosurgery

## 2011-05-03 DIAGNOSIS — Z8673 Personal history of transient ischemic attack (TIA), and cerebral infarction without residual deficits: Secondary | ICD-10-CM

## 2011-05-03 DIAGNOSIS — Z01812 Encounter for preprocedural laboratory examination: Secondary | ICD-10-CM

## 2011-05-03 DIAGNOSIS — Z981 Arthrodesis status: Secondary | ICD-10-CM

## 2011-05-03 DIAGNOSIS — Y831 Surgical operation with implant of artificial internal device as the cause of abnormal reaction of the patient, or of later complication, without mention of misadventure at the time of the procedure: Secondary | ICD-10-CM | POA: Diagnosis present

## 2011-05-03 DIAGNOSIS — T84498A Other mechanical complication of other internal orthopedic devices, implants and grafts, initial encounter: Secondary | ICD-10-CM | POA: Diagnosis present

## 2011-05-03 DIAGNOSIS — M4714 Other spondylosis with myelopathy, thoracic region: Principal | ICD-10-CM | POA: Diagnosis present

## 2011-05-03 DIAGNOSIS — I10 Essential (primary) hypertension: Secondary | ICD-10-CM | POA: Diagnosis present

## 2011-05-03 LAB — CBC
HCT: 27.7 % — ABNORMAL LOW (ref 36.0–46.0)
MCH: 27.3 pg (ref 26.0–34.0)
MCV: 81.2 fL (ref 78.0–100.0)
RBC: 3.41 MIL/uL — ABNORMAL LOW (ref 3.87–5.11)
WBC: 9.2 10*3/uL (ref 4.0–10.5)

## 2011-05-03 LAB — APTT: aPTT: 30 seconds (ref 24–37)

## 2011-05-03 SURGERY — POSTERIOR LUMBAR FUSION 3 LEVEL
Anesthesia: General | Site: Spine Thoracic | Wound class: Clean

## 2011-05-03 MED ORDER — METOPROLOL SUCCINATE ER 50 MG PO TB24
50.0000 mg | ORAL_TABLET | Freq: Every day | ORAL | Status: DC
  Administered 2011-05-03 – 2011-05-09 (×7): 50 mg via ORAL
  Filled 2011-05-03 (×9): qty 1

## 2011-05-03 MED ORDER — ACETAMINOPHEN 650 MG RE SUPP: 650.0000 mg | RECTAL | Status: DC | PRN

## 2011-05-03 MED ORDER — PROMETHAZINE HCL 25 MG/ML IJ SOLN
12.5000 mg | INTRAMUSCULAR | Status: DC | PRN
  Filled 2011-05-03: qty 1

## 2011-05-03 MED ORDER — SODIUM CHLORIDE 0.9 % IV SOLN
INTRAVENOUS | Status: AC
  Filled 2011-05-03: qty 500

## 2011-05-03 MED ORDER — LIDOCAINE-EPINEPHRINE 1 %-1:100000 IJ SOLN
INTRAMUSCULAR | Status: DC | PRN
  Administered 2011-05-03: 20 mL

## 2011-05-03 MED ORDER — SODIUM CHLORIDE 0.9 % IR SOLN
Status: DC | PRN
  Administered 2011-05-03: 09:00:00

## 2011-05-03 MED ORDER — SODIUM CHLORIDE 0.9 % IV SOLN: 250.0000 mL | INTRAVENOUS | Status: DC

## 2011-05-03 MED ORDER — BACITRACIN 50000 UNITS IM SOLR
INTRAMUSCULAR | Status: AC
  Filled 2011-05-03: qty 1

## 2011-05-03 MED ORDER — VECURONIUM BROMIDE 10 MG IV SOLR
INTRAVENOUS | Status: DC | PRN
  Administered 2011-05-03 (×2): 2 mg via INTRAVENOUS

## 2011-05-03 MED ORDER — VITAMIN B-12 100 MCG PO TABS
200.0000 ug | ORAL_TABLET | Freq: Every day | ORAL | Status: DC
  Administered 2011-05-03 – 2011-05-09 (×7): 200 ug via ORAL
  Filled 2011-05-03 (×8): qty 2

## 2011-05-03 MED ORDER — MAGNESIUM HYDROXIDE 400 MG/5ML PO SUSP: 30.0000 mL | Freq: Every day | ORAL | Status: DC | PRN

## 2011-05-03 MED ORDER — WHITE PETROLATUM GEL
Status: AC
  Filled 2011-05-03: qty 5

## 2011-05-03 MED ORDER — SODIUM CHLORIDE 0.9 % IJ SOLN: 9.0000 mL | INTRAMUSCULAR | Status: DC | PRN

## 2011-05-03 MED ORDER — CYCLOBENZAPRINE HCL 10 MG PO TABS
10.0000 mg | ORAL_TABLET | Freq: Three times a day (TID) | ORAL | Status: DC | PRN
  Administered 2011-05-07 – 2011-05-08 (×2): 10 mg via ORAL
  Filled 2011-05-03 (×2): qty 1

## 2011-05-03 MED ORDER — KCL IN DEXTROSE-NACL 20-5-0.45 MEQ/L-%-% IV SOLN
INTRAVENOUS | Status: DC
  Administered 2011-05-03 – 2011-05-05 (×4): via INTRAVENOUS
  Filled 2011-05-03 (×11): qty 1000

## 2011-05-03 MED ORDER — FENTANYL CITRATE 0.05 MG/ML IJ SOLN
INTRAMUSCULAR | Status: DC | PRN
  Administered 2011-05-03: 50 ug via INTRAVENOUS
  Administered 2011-05-03: 100 ug via INTRAVENOUS
  Administered 2011-05-03: 50 ug via INTRAVENOUS
  Administered 2011-05-03: 150 ug via INTRAVENOUS
  Administered 2011-05-03: 50 ug via INTRAVENOUS

## 2011-05-03 MED ORDER — KETOROLAC TROMETHAMINE 30 MG/ML IJ SOLN
30.0000 mg | Freq: Four times a day (QID) | INTRAMUSCULAR | Status: AC
  Administered 2011-05-03 (×3): 30 mg via INTRAVENOUS
  Filled 2011-05-03 (×3): qty 1

## 2011-05-03 MED ORDER — ALBUMIN HUMAN 5 % IV SOLN
INTRAVENOUS | Status: DC | PRN
  Administered 2011-05-03 (×2): via INTRAVENOUS

## 2011-05-03 MED ORDER — MORPHINE SULFATE (PF) 1 MG/ML IV SOLN
INTRAVENOUS | Status: DC
Start: 2011-05-03 — End: 2011-05-05
  Administered 2011-05-03 (×2): 3 mg via INTRAVENOUS
  Administered 2011-05-04: 1 mg via INTRAVENOUS
  Administered 2011-05-04: 6 mg via INTRAVENOUS
  Administered 2011-05-04: 0.1 mg via INTRAVENOUS
  Administered 2011-05-04: 3 mg via INTRAVENOUS
  Administered 2011-05-05: 2 mg via INTRAVENOUS
  Administered 2011-05-05: 4 mg via INTRAVENOUS

## 2011-05-03 MED ORDER — ACETAMINOPHEN 325 MG PO TABS: 650.0000 mg | ORAL_TABLET | ORAL | Status: DC | PRN

## 2011-05-03 MED ORDER — SODIUM CHLORIDE 0.9 % IJ SOLN: 3.0000 mL | INTRAMUSCULAR | Status: DC | PRN

## 2011-05-03 MED ORDER — ONDANSETRON HCL 4 MG/2ML IJ SOLN: 4.0000 mg | Freq: Once | INTRAMUSCULAR | Status: DC | PRN

## 2011-05-03 MED ORDER — CEFAZOLIN SODIUM 1-5 GM-% IV SOLN
1.0000 g | Freq: Three times a day (TID) | INTRAVENOUS | Status: AC
  Administered 2011-05-03 – 2011-05-05 (×6): 1 g via INTRAVENOUS
  Filled 2011-05-03 (×7): qty 50

## 2011-05-03 MED ORDER — KETOROLAC TROMETHAMINE 30 MG/ML IJ SOLN
INTRAMUSCULAR | Status: AC
  Filled 2011-05-03: qty 1

## 2011-05-03 MED ORDER — EPHEDRINE SULFATE 50 MG/ML IJ SOLN
INTRAMUSCULAR | Status: DC | PRN
  Administered 2011-05-03 (×2): 5 mg via INTRAVENOUS

## 2011-05-03 MED ORDER — ONDANSETRON HCL 4 MG/2ML IJ SOLN: 4.0000 mg | INTRAMUSCULAR | Status: DC | PRN

## 2011-05-03 MED ORDER — SODIUM CHLORIDE 0.9 % IJ SOLN
3.0000 mL | Freq: Two times a day (BID) | INTRAMUSCULAR | Status: DC
  Administered 2011-05-04 – 2011-05-05 (×2): 3 mL via INTRAVENOUS

## 2011-05-03 MED ORDER — 0.9 % SODIUM CHLORIDE (POUR BTL) OPTIME
TOPICAL | Status: DC | PRN
  Administered 2011-05-03: 1000 mL

## 2011-05-03 MED ORDER — DOCUSATE SODIUM 100 MG PO CAPS
100.0000 mg | ORAL_CAPSULE | Freq: Two times a day (BID) | ORAL | Status: DC
  Administered 2011-05-03 – 2011-05-09 (×13): 100 mg via ORAL
  Filled 2011-05-03 (×11): qty 1

## 2011-05-03 MED ORDER — NEOSTIGMINE METHYLSULFATE 1 MG/ML IJ SOLN
INTRAMUSCULAR | Status: DC | PRN
  Administered 2011-05-03: 5 mg via INTRAVENOUS

## 2011-05-03 MED ORDER — MORPHINE SULFATE (PF) 1 MG/ML IV SOLN
INTRAVENOUS | Status: AC
  Administered 2011-05-03: 13:00:00
  Filled 2011-05-03: qty 25

## 2011-05-03 MED ORDER — MENTHOL 3 MG MT LOZG: 1.0000 | LOZENGE | OROMUCOSAL | Status: DC | PRN

## 2011-05-03 MED ORDER — SODIUM CHLORIDE 0.9 % IR SOLN: Status: DC | PRN

## 2011-05-03 MED ORDER — BACITRACIN ZINC 500 UNIT/GM EX OINT
TOPICAL_OINTMENT | CUTANEOUS | Status: DC | PRN
  Administered 2011-05-03: 1 via TOPICAL

## 2011-05-03 MED ORDER — ONDANSETRON HCL 4 MG/2ML IJ SOLN: 4.0000 mg | Freq: Four times a day (QID) | INTRAMUSCULAR | Status: DC | PRN

## 2011-05-03 MED ORDER — DEXAMETHASONE SODIUM PHOSPHATE 4 MG/ML IJ SOLN
INTRAMUSCULAR | Status: DC | PRN
  Administered 2011-05-03: 10 mg via INTRAVENOUS

## 2011-05-03 MED ORDER — ONDANSETRON HCL 4 MG/2ML IJ SOLN
INTRAMUSCULAR | Status: DC | PRN
  Administered 2011-05-03: 4 mg via INTRAVENOUS

## 2011-05-03 MED ORDER — PROPOFOL 10 MG/ML IV BOLUS
INTRAVENOUS | Status: DC | PRN
  Administered 2011-05-03: 150 mg via INTRAVENOUS

## 2011-05-03 MED ORDER — ACETAMINOPHEN 10 MG/ML IV SOLN
INTRAVENOUS | Status: AC
  Administered 2011-05-03: 1000 mg via INTRAVENOUS
  Filled 2011-05-03: qty 100

## 2011-05-03 MED ORDER — METHOCARBAMOL 100 MG/ML IJ SOLN
500.0000 mg | Freq: Four times a day (QID) | INTRAVENOUS | Status: DC | PRN
  Filled 2011-05-03: qty 5

## 2011-05-03 MED ORDER — GLYCOPYRROLATE 0.2 MG/ML IJ SOLN
INTRAMUSCULAR | Status: DC | PRN
  Administered 2011-05-03: .8 mg via INTRAVENOUS

## 2011-05-03 MED ORDER — THROMBIN 20000 UNITS EX KIT
PACK | CUTANEOUS | Status: DC | PRN
  Administered 2011-05-03: 09:00:00 via TOPICAL

## 2011-05-03 MED ORDER — BISACODYL 10 MG RE SUPP
10.0000 mg | Freq: Every day | RECTAL | Status: DC | PRN
  Administered 2011-05-06: 10 mg via RECTAL
  Filled 2011-05-03: qty 1

## 2011-05-03 MED ORDER — HYDROMORPHONE HCL PF 1 MG/ML IJ SOLN
0.2500 mg | INTRAMUSCULAR | Status: DC | PRN
  Administered 2011-05-03 (×2): 0.5 mg via INTRAVENOUS

## 2011-05-03 MED ORDER — DIPHENHYDRAMINE HCL 12.5 MG/5ML PO ELIX: 12.5000 mg | ORAL_SOLUTION | Freq: Four times a day (QID) | ORAL | Status: DC | PRN

## 2011-05-03 MED ORDER — METHOCARBAMOL 500 MG PO TABS: 500.0000 mg | ORAL_TABLET | Freq: Four times a day (QID) | ORAL | Status: DC | PRN

## 2011-05-03 MED ORDER — ROCURONIUM BROMIDE 100 MG/10ML IV SOLN
INTRAVENOUS | Status: DC | PRN
  Administered 2011-05-03: 50 mg via INTRAVENOUS

## 2011-05-03 MED ORDER — HYDROMORPHONE HCL PF 1 MG/ML IJ SOLN
INTRAMUSCULAR | Status: AC
  Filled 2011-05-03: qty 1

## 2011-05-03 MED ORDER — PROMETHAZINE HCL 25 MG PO TABS: 12.5000 mg | ORAL_TABLET | ORAL | Status: DC | PRN

## 2011-05-03 MED ORDER — HETASTARCH-ELECTROLYTES 6 % IV SOLN
INTRAVENOUS | Status: DC | PRN
  Administered 2011-05-03: 09:00:00 via INTRAVENOUS

## 2011-05-03 MED ORDER — NALOXONE HCL 0.4 MG/ML IJ SOLN: 0.4000 mg | INTRAMUSCULAR | Status: DC | PRN

## 2011-05-03 MED ORDER — LACTATED RINGERS IV SOLN
INTRAVENOUS | Status: DC | PRN
  Administered 2011-05-03 (×2): via INTRAVENOUS

## 2011-05-03 MED ORDER — THROMBIN 20000 UNITS EX KIT: PACK | CUTANEOUS | Status: DC | PRN

## 2011-05-03 MED ORDER — FLEET ENEMA 7-19 GM/118ML RE ENEM: 1.0000 | ENEMA | Freq: Once | RECTAL | Status: AC | PRN

## 2011-05-03 MED ORDER — DIPHENHYDRAMINE HCL 50 MG/ML IJ SOLN: 12.5000 mg | Freq: Four times a day (QID) | INTRAMUSCULAR | Status: DC | PRN

## 2011-05-03 MED ORDER — ACETAMINOPHEN 10 MG/ML IV SOLN
1000.0000 mg | Freq: Once | INTRAVENOUS | Status: AC
  Administered 2011-05-03: 1000 mg via INTRAVENOUS

## 2011-05-03 MED ORDER — ZOLPIDEM TARTRATE 5 MG PO TABS: 5.0000 mg | ORAL_TABLET | Freq: Every evening | ORAL | Status: DC | PRN

## 2011-05-03 SURGICAL SUPPLY — 74 items
APL SKNCLS STERI-STRIP NONHPOA (GAUZE/BANDAGES/DRESSINGS) ×3
BAG DECANTER FOR FLEXI CONT (MISCELLANEOUS) ×2 IMPLANT
BENZOIN TINCTURE PRP APPL 2/3 (GAUZE/BANDAGES/DRESSINGS) ×3 IMPLANT
BLADE SURG ROTATE 9660 (MISCELLANEOUS) ×1 IMPLANT
BUR PRECISION FLUTE 5.0 (BURR) ×1 IMPLANT
CANISTER SUCTION 2500CC (MISCELLANEOUS) ×2 IMPLANT
CHLORAPREP W/TINT 26ML (MISCELLANEOUS) ×1 IMPLANT
CLOTH BEACON ORANGE TIMEOUT ST (SAFETY) ×2 IMPLANT
CONNECTOR EXPEDIUM SFX SZ A5 (Orthopedic Implant) ×1 IMPLANT
CONT SPEC 4OZ CLIKSEAL STRL BL (MISCELLANEOUS) ×4 IMPLANT
COVER BACK TABLE 24X17X13 BIG (DRAPES) ×1 IMPLANT
COVER TABLE BACK 60X90 (DRAPES) ×2 IMPLANT
DRAPE C-ARM 42X72 X-RAY (DRAPES) ×4 IMPLANT
DRAPE LAPAROTOMY 100X72X124 (DRAPES) ×2 IMPLANT
DRAPE POUCH INSTRU U-SHP 10X18 (DRAPES) ×2 IMPLANT
DRAPE PROXIMA HALF (DRAPES) IMPLANT
DRAPE SURG 17X23 STRL (DRAPES) ×1 IMPLANT
DRESSING TELFA 8X3 (GAUZE/BANDAGES/DRESSINGS) ×2 IMPLANT
DURAPREP 26ML APPLICATOR (WOUND CARE) ×1 IMPLANT
ELECT REM PT RETURN 9FT ADLT (ELECTROSURGICAL) ×2
ELECTRODE REM PT RTRN 9FT ADLT (ELECTROSURGICAL) ×1 IMPLANT
GAUZE SPONGE 4X4 16PLY XRAY LF (GAUZE/BANDAGES/DRESSINGS) IMPLANT
GLOVE BIOGEL PI IND STRL 6.5 (GLOVE) IMPLANT
GLOVE BIOGEL PI IND STRL 7.0 (GLOVE) IMPLANT
GLOVE BIOGEL PI IND STRL 7.5 (GLOVE) IMPLANT
GLOVE BIOGEL PI INDICATOR 6.5 (GLOVE) ×2
GLOVE BIOGEL PI INDICATOR 7.0 (GLOVE) ×1
GLOVE BIOGEL PI INDICATOR 7.5 (GLOVE) ×1
GLOVE ECLIPSE 7.5 STRL STRAW (GLOVE) ×5 IMPLANT
GLOVE EXAM NITRILE LRG STRL (GLOVE) IMPLANT
GLOVE EXAM NITRILE MD LF STRL (GLOVE) IMPLANT
GLOVE EXAM NITRILE XL STR (GLOVE) IMPLANT
GLOVE EXAM NITRILE XS STR PU (GLOVE) IMPLANT
GLOVE SURG SS PI 6.5 STRL IVOR (GLOVE) ×2 IMPLANT
GOWN BRE IMP SLV AUR LG STRL (GOWN DISPOSABLE) ×2 IMPLANT
GOWN BRE IMP SLV AUR XL STRL (GOWN DISPOSABLE) ×5 IMPLANT
GOWN STRL REIN 2XL LVL4 (GOWN DISPOSABLE) IMPLANT
IN SYTE AUTOGUARD 14GA 1.75IN ×1 IMPLANT
KIT BASIN OR (CUSTOM PROCEDURE TRAY) ×2 IMPLANT
KIT ROOM TURNOVER OR (KITS) ×2 IMPLANT
MILL MEDIUM DISP (BLADE) ×2 IMPLANT
NEEDLE BONE MARROW 8GX6 FENEST (NEEDLE) ×1 IMPLANT
NEEDLE HYPO 22GX1.5 SAFETY (NEEDLE) ×2 IMPLANT
NS IRRIG 1000ML POUR BTL (IV SOLUTION) ×2 IMPLANT
PACK LAMINECTOMY NEURO (CUSTOM PROCEDURE TRAY) ×2 IMPLANT
PAD ARMBOARD 7.5X6 YLW CONV (MISCELLANEOUS) ×6 IMPLANT
PATTIES SURGICAL .5 X.5 (GAUZE/BANDAGES/DRESSINGS) IMPLANT
PATTIES SURGICAL .5 X1 (DISPOSABLE) IMPLANT
PATTIES SURGICAL .75X.75 (GAUZE/BANDAGES/DRESSINGS) ×2 IMPLANT
PATTIES SURGICAL 1X1 (DISPOSABLE) IMPLANT
RASP 3.0MM (RASP) ×1 IMPLANT
ROD EXPEDIUM 120MM (Rod) ×1 IMPLANT
ROD EXPEDIUM PREBENT 85MM (Rod) ×1 IMPLANT
SCREW EXPEDIUM POLY 5X40 (Screw) ×4 IMPLANT
SCREW EXPEDIUM POLYAXIAL 6X45M (Screw) ×4 IMPLANT
SCREW SET SINGLE INNER (Screw) ×8 IMPLANT
SPONGE GAUZE 4X4 12PLY (GAUZE/BANDAGES/DRESSINGS) ×2 IMPLANT
SPONGE LAP 4X18 X RAY DECT (DISPOSABLE) IMPLANT
SPONGE SURGIFOAM ABS GEL 100 (HEMOSTASIS) ×1 IMPLANT
STAPLER SKIN PROX WIDE 3.9 (STAPLE) ×1 IMPLANT
STRIP CLOSURE SKIN 1/2X4 (GAUZE/BANDAGES/DRESSINGS) ×4 IMPLANT
STRIP VITOSS 25X100X4MM (Neuro Prosthesis/Implant) ×2 IMPLANT
SUT VIC AB 0 CT1 18XCR BRD8 (SUTURE) ×2 IMPLANT
SUT VIC AB 0 CT1 8-18 (SUTURE) ×6
SUT VIC AB 2-0 CP2 18 (SUTURE) ×4 IMPLANT
SUT VIC AB 3-0 SH 8-18 (SUTURE) ×4 IMPLANT
SYR 20CC LL (SYRINGE) ×2 IMPLANT
SYR CONTROL 10ML LL (SYRINGE) ×1 IMPLANT
TAPE CLOTH SURG 4X10 WHT LF (GAUZE/BANDAGES/DRESSINGS) ×1 IMPLANT
TOWEL OR 17X24 6PK STRL BLUE (TOWEL DISPOSABLE) ×2 IMPLANT
TOWEL OR 17X26 10 PK STRL BLUE (TOWEL DISPOSABLE) ×2 IMPLANT
TRAP SPECIMEN MUCOUS 40CC (MISCELLANEOUS) ×1 IMPLANT
TRAY FOLEY CATH 14FRSI W/METER (CATHETERS) ×2 IMPLANT
WATER STERILE IRR 1000ML POUR (IV SOLUTION) ×2 IMPLANT

## 2011-05-03 NOTE — Anesthesia Postprocedure Evaluation (Signed)
  Anesthesia Post-op Note  Patient: Suzanne Howard  Procedure(s) Performed: Procedure(s) (LRB): POSTERIOR LUMBAR FUSION 3 LEVEL (N/A)  Patient Location: PACU  Anesthesia Type: General  Level of Consciousness: awake, alert  and oriented  Airway and Oxygen Therapy: Patient Spontanous Breathing and Patient connected to nasal cannula oxygen  Post-op Pain: Moderate  Post-op Assessment: Post-op Vital signs reviewed and Patient's Cardiovascular Status Stable  Post-op Vital Signs: stable  Complications: No apparent anesthesia complications

## 2011-05-03 NOTE — Interval H&P Note (Signed)
History and Physical Interval Note:  05/03/2011 7:31 AM  Suzanne Howard  has presented today for surgery, with the diagnosis of Thoracic stenosis, Thoracic spondylosis with myelopathy, Prior fusion  The various methods of treatment have been discussed with the patient and family. After consideration of risks, benefits and other options for treatment, the patient has consented to  Procedure(s) (LRB): POSTERIOR LUMBAR FUSION 3 LEVEL (N/A) as a surgical intervention .  The patients' history has been reviewed, patient examined, no change in status, stable for surgery.  I have reviewed the patients' chart and labs.  Questions were answered to the patient's satisfaction.     Faust Thorington R

## 2011-05-03 NOTE — Transfer of Care (Signed)
Immediate Anesthesia Transfer of Care Note  Patient: Suzanne Howard  Procedure(s) Performed: Procedure(s) (LRB): POSTERIOR LUMBAR FUSION 3 LEVEL (N/A)  Patient Location: PACU  Anesthesia Type: General  Level of Consciousness: awake, alert  and oriented  Airway & Oxygen Therapy: Patient Spontanous Breathing and Patient connected to face mask oxygen  Post-op Assessment: Report given to PACU RN, Post -op Vital signs reviewed and stable and Patient moving all extremities X 4  Post vital signs: Reviewed and stable  Complications: No apparent anesthesia complications

## 2011-05-03 NOTE — H&P (Signed)
See H& P.

## 2011-05-03 NOTE — Anesthesia Procedure Notes (Signed)
Procedure Name: Intubation Date/Time: 05/03/2011 7:43 AM Performed by: Marena Chancy Pre-anesthesia Checklist: Patient identified, Emergency Drugs available, Suction available and Patient being monitored Patient Re-evaluated:Patient Re-evaluated prior to inductionOxygen Delivery Method: Circle system utilized Preoxygenation: Pre-oxygenation with 100% oxygen Intubation Type: IV induction Ventilation: Mask ventilation without difficulty and Oral airway inserted - appropriate to patient size Laryngoscope Size: Hyacinth Meeker and 2 Grade View: Grade II Tube type: Oral Number of attempts: 1 Placement Confirmation: ETT inserted through vocal cords under direct vision,  positive ETCO2 and breath sounds checked- equal and bilateral Secured at: 23 cm Tube secured with: Tape Dental Injury: Teeth and Oropharynx as per pre-operative assessment

## 2011-05-03 NOTE — Op Note (Signed)
05/03/2011  11:53 AM  PATIENT:  Suzanne Howard  78 y.o. female  PRE-OPERATIVE DIAGNOSIS:  Thoracic stenosis, Thoracic spondylosis with myelopathy, Prior fusion  POST-OPERATIVE DIAGNOSIS:  Thoracic stenosis, Thoracic spondylosis with myelopathy, Prior fusion  PROCEDURE:  Procedure(s): T8-11 redo  decompressive laminectomy (3L)  ,  POSTERIOR LUMBAR FUSION  T8-L1 (5L)  With autograft, vitoss , bone marrow aspirate,  Segmented pedicle screw fixation T8-L1  SURGEON:  Surgeon(s): Clydene Fake, MD Karn Cassis, MD  ANESTHESIA:   general  EBL:  Total I/O In: 2000 [I.V.:1000; IV Piggyback:1000] Out: 595 [Urine:95; Blood:500]  BLOOD ADMINISTERED:none  DRAINS: none   SPECIMEN:  No Specimen  DICTATION: Patient with right-sided hip pain leg pain and difficulty walking since the prior, had multiple prior lumbar fusion surgery well decompressed and fused T10-S1 but workup showing severe spinal spondylitic change and stenosis above the the surgery at T8-9 and T9-10 and patient brought in for the redo  decompression of this area with extension of the fusion.  Patient brought into the operating room anesthesia induced patient placed in a prone position in a Wilson frame with all pressure points padded. Patient prepped and draped in a sterile fashion 7 incision injected with 20 cc 1% lidocaine with epinephrine. An incision was made above the prior scar in the midline of thoracic spine and down into the upper most portion of the scar in the upper thoracic and lumbar area. Tingling of the fascia hemostasis obtained with Bovie cauterization the fascia was incised and subperiosteal dissection was done over the mid lower thoracic area exposing the spinous process lamina finder the pedicle screws and we dissected out to the pedicle screws and rods at T10 T11-T12 to the cross connector that was just screws at T12. The cross connector was then removed and using an metalcutting bur unless we cut the rod  between T12 and L1. We then removed the locking nuts and rod from the left side 2010-12) we had fractured screws at T10 and T11 to remove the screw at T12 [place] screw and replaced it with a expedient screw. Right side between T11 and T12 were removed locking nuts and rods and remove the screws at T10 and T11 the comminuted fracture [place] T10 on the right side. Using a screw removal [employer] we're to grab onto the screws and the right-sided T10 left-sided T11 and remove them and then replaced the screws and that is for the right side to k with expedient screws. We didn't to we did not try to remove a screw from the left-sided T11. Then did the laminectomy removing most of the T8 spinous process lamina of T9-T10 decompressing the central canal spinal cord. We discussed with high-speed drill [place] rongeurs Kerrison punches. All bone was cleaned from soft tissue and saved for use later in the case using fluoroscopy report with him for T8 and T9-1 high-speed drill to decorticate the area placed a probe down the pedicle checking the hole for which we did bony circumference tactile aspirated bone marrow aspirate from the pedicles and placed on the Vitoss sponge and placed Expedium pedicle screw is indicated and T9 bilaterally. There patient arrives in place the rod to the screw heads the right side) T8 T9-T10 and T11 on the left side went to T. T9-T11 and T12. Locking nuts and final tightened locking nuts then placed a cross connector between the rods. Placed the rods to AP lateral pursestring imaging showing good position of the screws. We explored the decompression  which for a good decompression we did hemostasis. High-speed drill to decorticate the lamina and facets she continued to L1 and the autograft bone and then the bone marrow aspirate soaked Vitoss sponges were then packed in the posterolateral gutters for posterolateral fusion T8-L1 bilaterally. Retractors removed with hemostasi patient was 0 Vicryl  interrupted sutures subcutaneous tissue closed with 02 0 Vicryl interrupted sutures skin closed with staples dressing was placed patient was placed in supine position woken from anesthesia and transferred to recovery room.  PLAN OF CARE: Admit to inpatient   PATIENT DISPOSITION:  PACU - hemodynamically stable.

## 2011-05-03 NOTE — Preoperative (Signed)
Beta Blockers   Reason not to administer Beta Blockers:Not Applicable 

## 2011-05-03 NOTE — Anesthesia Preprocedure Evaluation (Addendum)
Anesthesia Evaluation  Patient identified by MRN, date of birth, ID band Patient awake    Reviewed: Allergy & Precautions, H&P , Patient's Chart, lab work & pertinent test results  Airway Mallampati: II TM Distance: >3 FB Neck ROM: Full    Dental  (+) Teeth Intact   Pulmonary  breath sounds clear to auscultation        Cardiovascular hypertension, Pt. on home beta blockers - Valvular Problems/MurmursRhythm:Regular Rate:Normal + Systolic murmurs    Neuro/Psych CVA, No Residual Symptoms    GI/Hepatic   Endo/Other    Renal/GU      Musculoskeletal   Abdominal   Peds  Hematology   Anesthesia Other Findings   Reproductive/Obstetrics                          Anesthesia Physical Anesthesia Plan  ASA: III  Anesthesia Plan: General   Post-op Pain Management:    Induction: Intravenous  Airway Management Planned: Oral ETT  Additional Equipment:   Intra-op Plan:   Post-operative Plan: Extubation in OR  Informed Consent: I have reviewed the patients History and Physical, chart, labs and discussed the procedure including the risks, benefits and alternatives for the proposed anesthesia with the patient or authorized representative who has indicated his/her understanding and acceptance.   Dental advisory given  Plan Discussed with: CRNA and Surgeon  Anesthesia Plan Comments: (Thorrcic spondylosis s/p fusion with broken screw and myelopathy Htn H/O stroke 1997 with no residual per pt.  Plan GA with art line  Kipp Brood, MD)        Anesthesia Quick Evaluation

## 2011-05-04 LAB — POCT I-STAT 7, (LYTES, BLD GAS, ICA,H+H)
Acid-base deficit: 2 mmol/L (ref 0.0–2.0)
Calcium, Ion: 1.2 mmol/L (ref 1.12–1.32)
O2 Saturation: 100 %
TCO2: 26 mmol/L (ref 0–100)
pO2, Arterial: 411 mmHg — ABNORMAL HIGH (ref 80.0–100.0)

## 2011-05-04 LAB — CBC
HCT: 28 % — ABNORMAL LOW (ref 36.0–46.0)
Hemoglobin: 9.4 g/dL — ABNORMAL LOW (ref 12.0–15.0)
MCH: 27.6 pg (ref 26.0–34.0)
MCHC: 33.6 g/dL (ref 30.0–36.0)

## 2011-05-04 LAB — BASIC METABOLIC PANEL
BUN: 21 mg/dL (ref 6–23)
CO2: 24 mEq/L (ref 19–32)
Calcium: 8.5 mg/dL (ref 8.4–10.5)
GFR calc non Af Amer: 48 mL/min — ABNORMAL LOW (ref >90)
Glucose, Bld: 123 mg/dL — ABNORMAL HIGH (ref 70–99)
Potassium: 4.3 mEq/L (ref 3.5–5.1)

## 2011-05-04 NOTE — Evaluation (Addendum)
Physical Therapy Evaluation Patient Details Name: Suzanne Howard MRN: [id number] DOB: [birth year] Today's Date: 05/04/2011  Problem List: There is no problem list on file for this patient.   Past Medical History:  Past Medical History  Diagnosis Date  . Hypertension   . Heart murmur     Evaluated by [facility] cardiology last year pt reports no follow up required  . Thoracic stenosis   . Kidney stones   . Stroke 1998    no residual effects  . Thoracic spondylosis with myelopathy   . History of pneumonia     as a child   Past Surgical History:  Past Surgical History  Procedure Date  . Lithotripsy 2012  . Thoracic fusion   . Appendectomy     as a teenager  . Endometrial ablation   . Cataract extraction, bilateral   . Tonsillectomy and adenoidectomy     PT Assessment/Plan/Recommendation PT Assessment Clinical Impression Statement: Patient presents S/P T8-11 redo  decompressive laminectomy and also with posterior fusion T8-11. She presents with feeling of generalized weakness that she relates to poor PO intake since Sunday in addition to surgery. She will likely require home health PT to ensure that she is safe at home and we will follow acutely to decrease the burden of care on her spouse.  PT Recommendation/Assessment: Patient will need skilled PT in the acute care venue PT Problem List: Decreased activity tolerance;Decreased mobility;Decreased knowledge of precautions;Decreased knowledge of use of DME;Pain PT Therapy Diagnosis : Difficulty walking;Acute pain PT Plan PT Frequency: Min 5X/week PT Treatment/Interventions: Gait training;DME instruction;Stair training;Functional mobility training;Therapeutic activities;Therapeutic exercise;Patient/family education PT Recommendation Follow Up Recommendations: Home health PT Equipment Recommended: Rolling walker with 5" wheels PT Goals  Acute Rehab PT Goals PT Goal Formulation: With patient Time For Goal Achievement: 7 days Pt  will Roll Supine to Right Side: with supervision PT Goal: Rolling Supine to Right Side - Progress: Goal set today Pt will Roll Supine to Left Side: with supervision PT Goal: Rolling Supine to Left Side - Progress: Goal set today Pt will go Supine/Side to Sit: with supervision PT Goal: Supine/Side to Sit - Progress: Goal set today Pt will go Sit to Supine/Side: with supervision PT Goal: Sit to Supine/Side - Progress: Goal set today Pt will go Sit to Stand: with supervision;with upper extremity assist PT Goal: Sit to Stand - Progress: Goal set today Pt will go Stand to Sit: with supervision;with upper extremity assist PT Goal: Stand to Sit - Progress: Goal set today Pt will Ambulate: >150 feet;with supervision;with least restrictive assistive device PT Goal: Ambulate - Progress: Goal set today Pt will Go Up / Down Stairs: Flight;with supervision;with rail(s) PT Goal: Up/Down Stairs - Progress: Goal set today Additional Goals Additional Goal #1: Patient will recall 3/3 back precautions and demonstrate adherence to in functional activity PT Goal: Additional Goal #1 - Progress: Goal set today  PT Evaluation Precautions/Restrictions  Precautions Precautions: Back Precaution Booklet Issued: Yes (comment) Precaution Comments: Educated in post-op back precautions, posture, and body mechanics Required Braces or Orthoses: Yes Spinal Brace: Thoracolumbosacral orthotic;Applied in sitting position Prior Functioning  Home Living Lives With: Spouse Receives Help From: Family (spouse) Type of Home: House Home Layout: Two level Alternate Level Stairs-Rails: Right;Left;Can reach both Alternate Level Stairs-Number of Steps: 15. Flight of stairs to basement too where cars are parked in garage. Home Access: Stairs to enter Entrance Stairs-Rails: Right;Left;Can reach both Entrance Stairs-Number of Steps: 4 Bathroom Shower/Tub: Health visitor: Standard Home  Adaptive Equipment:  Straight cane;Bedside commode/3-in-1 Additional Comments: Using cane for 2 months outdoors. Very old walker from mother. Prior Function Level of Independence: Independent with basic ADLs;Independent with homemaking with ambulation Driving: Yes Vocation: Retired Financial risk analyst Arousal/Alertness: Awake/alert Orientation Level: Oriented to person;Oriented to place;Oriented to situation Sensation/Coordination Sensation Light Touch: Appears Intact Proprioception: Appears Intact Coordination Gross Motor Movements are Fluid and Coordinated: Yes Fine Motor Movements are Fluid and Coordinated: Yes Extremity Assessment RLE Assessment RLE Assessment: Within Functional Limits LLE Assessment LLE Assessment: Within Functional Limits Mobility (including Balance) Bed Mobility Bed Mobility: Yes Rolling Left: 4: Min assist Rolling Left Details (indicate cue type and reason): Education in log roll technique and requires min Assist for accurate execution. Left Sidelying to Sit: 3: Mod assist Left Sidelying to Sit Details (indicate cue type and reason): For initation of trunk and to ensure correct sequencing of trunk and lower extremities Sitting - Scoot to Edge of Bed: 4: Min assist Transfers Transfers: Yes Sit to Stand: 4: Min assist;With upper extremity assist;From bed Sit to Stand Details (indicate cue type and reason): Education on correct hand placement to and from orlling walker. Assistance for initiation and through range. Stand to Sit: To chair/3-in-1;4: Min assist;With upper extremity assist;With armrests Stand to Sit Details: For control of descent and cues to prevent twisting. Ambulation/Gait Ambulation/Gait: Yes Ambulation/Gait Assistance: 4: Min assist Ambulation/Gait Assistance Details (indicate cue type and reason): Limited by pain and feeling of weakness and mild nausea. Verbal cues for posture and to stay safe distance from walker as tendency for flexion of trunk. Ambulation  Distance (Feet): 40 Feet Assistive device: Rolling walker Gait Pattern: Step-through pattern;Decreased stride length;Trunk flexed    End of Session PT - End of Session Equipment Utilized During Treatment: Gait belt/TLSO Activity Tolerance: Patient limited by pain;Patient limited by fatigue Patient left: in chair;with call bell in reach Nurse Communication: Mobility status for ambulation;Mobility status for transfers General Behavior During Session: [facility] for tasks performed Cognition: [facility] for tasks performed  Edwyna Perfect, PT  Pager [phone]  05/04/2011, 9:57 AM

## 2011-05-04 NOTE — Progress Notes (Signed)
Subjective: Patient reports minimal incisional pain, no leg pain, not OOB yet  Objective: Vital signs in last 24 hours: Temp:  [96.8 F (36 C)-98.1 F (36.7 C)] 98.1 F (36.7 C) (03/20 0509) Pulse Rate:  [66-95] 95  (03/20 0509) Resp:  [14-19] 18  (03/20 0509) BP: (132-179)/(68-105) 148/79 mmHg (03/20 0509) SpO2:  [93 %-100 %] 98 % (03/20 0509) Arterial Line BP: (174-188)/(73-89) 174/89 mmHg (03/19 1227) FiO2 (%):  [8 %] 8 % (03/19 1156)  Intake/Output from previous day: 03/19 0701 - 03/20 0700 In: 2200 [I.V.:1200; IV Piggyback:1000] Out: 2545 [Urine:1945; Blood:600] Intake/Output this shift:    moves LE well, sens intact - incision Sl drainage  Lab Results:  [facility] 05/04/11 0515 05/03/11 1220  WBC 9.3 9.2  HGB 9.4* 9.3*  HCT 28.0* 27.7*  PLT 154 151   BMET  Basename 05/04/11 0515  NA 136  K 4.3  CL 103  CO2 24  GLUCOSE 123*  BUN 21  CREATININE 1.04  CALCIUM 8.5    Studies/Results: Dg Thoracic Spine 1 View  05/03/2011  *RADIOLOGY REPORT*  Clinical Data: Revision of T8-11 fusion.  THORACIC SPINE - 1 VIEW  Comparison: Plain pills of the thoracic spine 04/01/2011 and MRI thoracic spine 04/14/2011.  Findings: We are provided two fluoroscopic spot views of the lower thoracic spine.  Pedicle screws are in place from T8-L1. There may be additional more inferior pedicle screws.  Stabilization bar on the right at T12-L1 is noted.  IMPRESSION: Revision of lower thoracic fusion as described.  Original Report Authenticated By: Bernadene Bell. Maricela Curet, M.D.    Assessment/Plan: Pt  Doing well - increase activity  LOS: 1 day     Stephania Macfarlane R, MD 05/04/2011, 8:21 AM

## 2011-05-04 NOTE — Progress Notes (Signed)
UR COMPLETED  

## 2011-05-04 NOTE — Evaluation (Signed)
Occupational Therapy Evaluation Patient Details Name: Suzanne Howard MRN: [id number] DOB: 21-Jan-1925 Today's Date: 05/04/2011  Problem List: There is no problem list on file for this patient.   Past Medical History:  Past Medical History  Diagnosis Date  . Hypertension   . Heart murmur     Evaluated by [facility] cardiology last year pt reports no follow up required  . Thoracic stenosis   . Kidney stones   . Stroke 1998    no residual effects  . Thoracic spondylosis with myelopathy   . History of pneumonia     as a child   Past Surgical History:  Past Surgical History  Procedure Date  . Lithotripsy 2012  . Thoracic fusion   . Appendectomy     as a teenager  . Endometrial ablation   . Cataract extraction, bilateral   . Tonsillectomy and adenoidectomy     OT Assessment/Plan/Recommendation OT Assessment Clinical Impression Statement: Patient presents S/P T8-11 redo  decompressive laminectomy and also with posterior fusion T8-11. She presents with feeling of generalized weakness and reports pain with some ADL tasks performed. She will likely require home health OT to ensure that she is safe at home and maintain back precautions.  We will follow acutely to decrease the burden of care on her spouse.   Recommend shower chair with back. OT Recommendation/Assessment: Patient will need skilled OT in the acute care venue OT Problem List: Decreased strength;Decreased activity tolerance;Decreased knowledge of precautions;Pain Barriers to Discharge: None OT Therapy Diagnosis : Generalized weakness;Acute pain OT Plan OT Frequency: Min 2X/week OT Treatment/Interventions: Self-care/ADL training;DME and/or AE instruction;Therapeutic activities;Patient/family education OT Recommendation Follow Up Recommendations: Home health OT Equipment Recommended: Tub/shower seat Individuals Consulted Consulted and Agree with Results and Recommendations: Patient OT Goals Acute Rehab OT Goals OT Goal  Formulation: With patient Time For Goal Achievement: 7 days ADL Goals Pt Will Perform Lower Body Bathing: with supervision;Standing at sink;Sitting, chair;with adaptive equipment ADL Goal: Lower Body Bathing - Progress: Goal set today Pt Will Perform Upper Body Dressing: with min assist;Sitting, chair;Standing ADL Goal: Upper Body Dressing - Progress: Goal set today Pt Will Perform Lower Body Dressing: with adaptive equipment;with cueing (comment type and amount);with min assist;Sitting, chair;Standing ADL Goal: Lower Body Dressing - Progress: Goal set today Pt Will Perform Toileting - Clothing Manipulation: with min assist;Standing ADL Goal: Toileting - Clothing Manipulation - Progress: Goal set today Pt Will Perform Toileting - Hygiene: Sit to stand from 3-in-1/toilet;with adaptive equipment;with min assist ADL Goal: Toileting - Hygiene - Progress: Goal set today  OT Evaluation Precautions/Restrictions  Precautions Precautions: Back Precaution Booklet Issued: Yes (comment) Precaution Comments: Educated in post-op back precautions, posture, and body mechanics Required Braces or Orthoses: Yes Spinal Brace: Thoracolumbosacral orthotic;Applied in sitting position Prior Functioning Home Living Lives With: Spouse Receives Help From: Family (spouse) Type of Home: House Home Layout: Two level Bathroom Shower/Tub: Walk-in shower;Door [employer] Toilet: Standard Home Adaptive Equipment: Straight cane;Bedside commode/3-in-1 Additional Comments: Using cane for 2 months outdoors. Very old walker from mother. Prior Function Level of Independence: Independent with basic ADLs;Independent with homemaking with ambulation Driving: Yes Vocation: Retired ADL ADL Eating/Feeding: Simulated;Modified independent Grooming: Simulated;Supervision/safety Where Assessed - Grooming: Sitting, chair;Standing at sink Upper Body Bathing: Simulated;Set up Upper Body Bathing Details (indicate cue type and  reason): My husband helps me with my brace Where Assessed - Upper Body Bathing: Sitting, chair Lower Body Bathing: Simulated;Moderate assistance Where Assessed - Lower Body Bathing: Sitting, chair;Standing at sink Upper Body Dressing: Simulated  Where Assessed - Upper Body Dressing: Sitting, chair Lower Body Dressing: Simulated;Performed;Maximal assistance Where Assessed - Lower Body Dressing: Sitting, chair;Standing Toilet Transfer: Performed Toilet Transfer Method: Stand pivot;Ambulating Acupuncturist: Regular height toilet;Grab bars (recommend 3 in 1 over commode, RN aware and will provide) Toileting - Clothing Manipulation: Minimal assistance;Performed Where Assessed - Glass blower/designer Manipulation: Standing Toileting - Hygiene: Simulated;+1 Total assistance Where Assessed - Toileting Hygiene: Sit on 3-in-1 or toilet Tub/Shower Transfer: Not assessed Equipment Used: Rolling walker ADL Comments: patient reported discomfort and pain with attempts to perform toileting and LB b/dsg tasks Vision/Perception  Vision - History Baseline Vision: Wears glasses all the time Cognition Cognition Arousal/Alertness: Awake/alert Overall Cognitive Status: Appears within functional limits for tasks assessed Orientation Level: Oriented to person;Oriented to place;Oriented to situation Sensation/Coordination Sensation Light Touch: Appears Intact Proprioception: Appears Intact Coordination Gross Motor Movements are Fluid and Coordinated: Yes Fine Motor Movements are Fluid and Coordinated: Yes Extremity Assessment RUE Assessment RUE Assessment: Exceptions to [facility] RUE Strength RUE Overall Strength Comments: overall WFL for AROM and strength distal to shoulders, shoulder strength not assesses secondary to c/o pain with MMT LUE Assessment LUE Assessment: Exceptions to [facility] LUE Strength LUE Overall Strength Comments: overall WFL for AROM and strength distal to shoulders, shoulder  strength not assessed secondary to c/o pain with MMT Mobility  Bed Mobility Bed Mobility: Yes Rolling Left: 4: Min assist Rolling Left Details (indicate cue type and reason): Education in log roll technique and requires min Assist for accurate execution. Left Sidelying to Sit: 3: Mod assist Left Sidelying to Sit Details (indicate cue type and reason): For initation of trunk and to ensure correct sequencing of trunk and lower extremities Sitting - Scoot to Edge of Bed: 4: Min assist Transfers Transfers: Yes Sit to Stand: 4: Min assist;With upper extremity assist;From chair/3-in-1;With armrests;From toilet Sit to Stand Details (indicate cue type and reason): Education on correct hand placement to and from orlling walker. Assistance for initiation and through range. Stand to Sit: To chair/3-in-1;4: Min assist;With upper extremity assist;With armrests;To toilet Stand to Sit Details: For control of descent and cues to prevent twisting. End of Session OT - End of Session Equipment Utilized During Treatment: Back brace Activity Tolerance: Patient tolerated treatment well;Patient limited by fatigue;Patient limited by pain Patient left: in chair Nurse Communication: Other (comment) (RN to provide 3 in 1 commode over toilet) General Behavior During Session: [facility] for tasks performed Cognition: [facility] for tasks performed  Pleas Patricia, MS, OTR/L Occupational Therapist Pager [phone]  05/04/2011, 10:20 AM

## 2011-05-04 NOTE — Progress Notes (Signed)
   CARE MANAGEMENT NOTE 05/04/2011  Patient:  Suzanne Howard, Suzanne Howard   Account Number:  [id number]  Date Initiated:  05/04/2011  Documentation initiated by:  Disha Cottam  Subjective/Objective Assessment:   Order for [facility] and DME as needed.     Action/Plan:   Met with pt who selected [facility] for HHPT, pt has DME   Anticipated DC Date:  05/06/2011   Anticipated DC Plan:  HOME W HOME HEALTH SERVICES         [facility] Choice  HOME HEALTH   Choice offered to / List presented to:  C-1 Patient        HH arranged  HH-2 PT      [facility] agency  [facility]   Status of service:  Completed, signed off Medicare Important Message given?   (If response is "NO", the following Medicare IM given date fields will be blank) Date Medicare IM given:   Date Additional Medicare IM given:    Discharge Disposition:  HOME W HOME HEALTH SERVICES  Per UR Regulation:    If discussed at Long Length of Stay Meetings, dates discussed:    Comments:

## 2011-05-05 DIAGNOSIS — M4714 Other spondylosis with myelopathy, thoracic region: Secondary | ICD-10-CM

## 2011-05-05 MED ORDER — MORPHINE SULFATE 2 MG/ML IJ SOLN: 1.0000 mg | INTRAMUSCULAR | Status: DC | PRN

## 2011-05-05 MED ORDER — OXYCODONE-ACETAMINOPHEN 5-325 MG PO TABS
1.0000 | ORAL_TABLET | ORAL | Status: DC | PRN
  Administered 2011-05-05: 1 via ORAL
  Administered 2011-05-06 (×2): 2 via ORAL
  Administered 2011-05-07: 1 via ORAL
  Administered 2011-05-07 – 2011-05-09 (×6): 2 via ORAL
  Filled 2011-05-05: qty 2
  Filled 2011-05-05: qty 1
  Filled 2011-05-05 (×6): qty 2
  Filled 2011-05-05: qty 1
  Filled 2011-05-05: qty 2

## 2011-05-05 MED ORDER — HYDROCODONE-ACETAMINOPHEN 10-325 MG PO TABS: 0.5000 | ORAL_TABLET | ORAL | Status: DC | PRN

## 2011-05-05 NOTE — Progress Notes (Signed)
Doing well. C/o appropriate incisional soreness. No leg pain No Numbness, tingling, weakness No Nausea /vomiting Starting to Amb  Temp:  [97.6 F (36.4 C)-98.7 F (37.1 C)] 98 F (36.7 C) (03/21 0600) Pulse Rate:  [72-85] 83  (03/21 0600) Resp:  [13-20] 20  (03/21 0600) BP: (127-176)/(61-85) 176/85 mmHg (03/21 0600) SpO2:  [94 %-99 %] 97 % (03/21 0600) FiO2 (%):  [28 %-37 %] 28 % (03/20 2000) Good strength and sensation Incision CDI  Plan: Increase activity, consult rehab

## 2011-05-05 NOTE — Progress Notes (Signed)
Clinical Social Work Department BRIEF PSYCHOSOCIAL ASSESSMENT 05/05/2011  Patient:  Suzanne Howard, Suzanne Howard     Account Number:  [id number]     Admit date:  05/03/2011  Clinical Social Worker:  Peggyann Shoals  Date/Time:  05/05/2011 02:00 PM  Referred by:  Physician  Date Referred:  05/04/2011 Referred for  Psychosocial assessment   Other Referral:   Interview type:  Patient Other interview type:    PSYCHOSOCIAL DATA Living Status:  HUSBAND Admitted from facility:   Level of care:   Primary support name:  Julieanne Manson Primary support relationship to patient:  SPOUSE Degree of support available:   Very supportive    CURRENT CONCERNS Current Concerns  None Noted   Other Concerns:    SOCIAL WORK ASSESSMENT / PLAN CSW met with pt to address consult for SNF. CSW introduced herself and explained role of social work. Currently, PT is recommending home health PT. Pt inquired what the recommendation of this CSW's thoughts around discharge plan. CSW shared that PT is recommending home health PT. Pt shared that if she were to go to SNF, she would only want [facility]. CSW inquired about a second choice, which pt shared that she would rather go home with home health services. Pt shared that since PT is recommending home health PT, that is what she will do. Pt reported the her husband is vey supportive and is able to provide care.   Assessment/plan status:  No Further Intervention Required Other assessment/ plan:   CSW is signing off as no further social work needs identified. CSW spoke with [facility] admissions coordinator regarding dispo. CSW consutled with RNCM, who is following pt and arranged for home health services. Please reconsult if a need arises prior to discharge.   Information/referral to community resources:    PATIENT'S/FAMILY'S RESPONSE TO PLAN OF CARE: Pt was very pleasant and alert and oriented. Pt would like to discharge home with home health services. Her husband will  provide care and support in the home.

## 2011-05-05 NOTE — Consult Note (Signed)
Physical Medicine and Rehabilitation Consult Reason for Consult: difficulty walking and right hip pain. Referring Phsyician: Dr. Phoebe Perch.    HPI: Suzanne Howard is an 78 y.o. female with history of HTN, multiple prior lumbar surgeries, but with right hip pain with difficulty walking due to myelopathy from severe spondylosis and stenosis at T8-9 and T9-10.  Patient elected to undergo redo T8-11 decompressive laminectomy on 03/19 by Dr. Phoebe Perch.  PT/OT evaluations done yesterday and patient limited by nausea and pain.  MD recommending CIR.  Review of Systems  Eyes: Negative for blurred vision and double vision.  Respiratory: Negative for cough and shortness of breath.   Cardiovascular: Negative for chest pain and palpitations.  Gastrointestinal: Negative for nausea and abdominal pain.  Neurological: Positive for weakness.   Past Medical History  Diagnosis Date  . Hypertension   . Heart murmur     Evaluated by [facility] cardiology last year pt reports no follow up required  . Thoracic stenosis   . Kidney stones   . Stroke 1998    no residual effects  . Thoracic spondylosis with myelopathy   . History of pneumonia     as a child   Past Surgical History  Procedure Date  . Lithotripsy 2012  . Thoracic fusion   . Appendectomy     as a teenager  . Endometrial ablation   . Cataract extraction, bilateral   . Tonsillectomy and adenoidectomy    Family History  Problem Relation Age of Onset  . Anesthesia problems Neg Hx    Social History:  Married.  Independent with quad cane PTA.  Suzanne Howard reports that Suzanne Howard has never smoked. Suzanne Howard has never used smokeless tobacco. Suzanne Howard reports that Suzanne Howard does not drink alcohol or use illicit drugs. Husband supportive and can assist past discharge.   Allergies  Allergen Reactions  . Betadine (Povidone Iodine) Itching    Pt can tolerate shrimp and shellfish    Prior to Admission medications   Medication Sig Start Date End Date Taking? Authorizing Provider    clopidogrel (PLAVIX) 75 MG tablet Take 75 mg by mouth daily.   Yes Historical Provider, MD  Coenzyme Q10 (CO Q 10 PO) Take 2 tablets by mouth daily.   Yes Historical Provider, MD  cyanocobalamin 100 MCG tablet Take 200 mcg by mouth daily.   Yes Historical Provider, MD  metoprolol succinate (TOPROL-XL) 50 MG 24 hr tablet Take 50 mg by mouth daily. Take with or immediately following a meal.   Yes Historical Provider, MD   Scheduled Medications:    .  ceFAZolin (ANCEF) IV  1 g Intravenous Q8H  . docusate sodium  100 mg Oral BID  . metoprolol succinate  50 mg Oral Daily  . sodium chloride  3 mL Intravenous Q12H  . cyanocobalamin  200 mcg Oral Daily  . DISCONTD: morphine   Intravenous Q4H   PRN MED's: acetaminophen, acetaminophen, bisacodyl, cyclobenzaprine, HYDROcodone-acetaminophen, magnesium hydroxide, menthol-cetylpyridinium, methocarbamol (ROBAXIN) IV, methocarbamol, morphine injection, ondansetron (ZOFRAN) IV, oxyCODONE-acetaminophen, promethazine, promethazine, sodium chloride, zolpidem, DISCONTD: diphenhydrAMINE, DISCONTD: diphenhydrAMINE, DISCONTD: naloxone, DISCONTD: sodium chloride Home: Home Living Lives With: Spouse Receives Help From: Family (spouse) Type of Home: House Home Layout: Two level Alternate Level Stairs-Rails: Right;Left;Can reach both Alternate Level Stairs-Number of Steps: 15. Flight of stairs to basement too where cars are parked in garage. Home Access: Stairs to enter Entrance Stairs-Rails: Right;Left;Can reach both Entrance Stairs-Number of Steps: 4 Bathroom Shower/Tub: Walk-in shower;Door [employer] Toilet: Standard Home Adaptive Equipment: Straight cane;Bedside commode/3-in-1 Additional  Comments: Using cane for 2 months outdoors. Very old walker from mother.  Functional History: Prior Function Level of Independence: Independent with basic ADLs;Independent with homemaking with ambulation Driving: Yes Vocation: Retired Functional Status:  Mobility: Bed  Mobility Bed Mobility: Yes Rolling Left: 4: Min assist Rolling Left Details (indicate cue type and reason): Education in log roll technique and requires min Assist for accurate execution. Left Sidelying to Sit: 3: Mod assist Left Sidelying to Sit Details (indicate cue type and reason): For initation of trunk and to ensure correct sequencing of trunk and lower extremities Sitting - Scoot to Edge of Bed: 4: Min assist Transfers Transfers: Yes Sit to Stand: 4: Min assist;With upper extremity assist;From chair/3-in-1;With armrests;From toilet Sit to Stand Details (indicate cue type and reason): Education on correct hand placement to and from orlling walker. Assistance for initiation and through range. Stand to Sit: To chair/3-in-1;4: Min assist;With upper extremity assist;With armrests;To toilet Stand to Sit Details: For control of descent and cues to prevent twisting. Ambulation/Gait Ambulation/Gait: Yes Ambulation/Gait Assistance: 4: Min assist Ambulation/Gait Assistance Details (indicate cue type and reason): Limited by pain and feeling of weakness and mild nausea. Verbal cues for posture and to stay safe distance from walker as tendency for flexion of trunk. Ambulation Distance (Feet): 40 Feet Assistive device: Rolling walker Gait Pattern: Step-through pattern;Decreased stride length;Trunk flexed    ADL: ADL Eating/Feeding: Simulated;Modified independent Grooming: Simulated;Supervision/safety Where Assessed - Grooming: Sitting, chair;Standing at sink Upper Body Bathing: Simulated;Set up Upper Body Bathing Details (indicate cue type and reason): My husband helps me with my brace Where Assessed - Upper Body Bathing: Sitting, chair Lower Body Bathing: Simulated;Moderate assistance Where Assessed - Lower Body Bathing: Sitting, chair;Standing at sink Upper Body Dressing: Simulated Where Assessed - Upper Body Dressing: Sitting, chair Lower Body Dressing: Simulated;Performed;Maximal  assistance Where Assessed - Lower Body Dressing: Sitting, chair;Standing Toilet Transfer: Performed Toilet Transfer Method: Stand pivot;Ambulating Acupuncturist: Regular height toilet;Grab bars (recommend 3 in 1 over commode, RN aware and will provide) Toileting - Clothing Manipulation: Minimal assistance;Performed Where Assessed - Glass blower/designer Manipulation: Standing Toileting - Hygiene: Simulated;+1 Total assistance Where Assessed - Toileting Hygiene: Sit on 3-in-1 or toilet Tub/Shower Transfer: Not assessed Equipment Used: Rolling walker ADL Comments: patient reported discomfort and pain with attempts to perform toileting and LB b/dsg tasks  Cognition: Cognition Arousal/Alertness: Awake/alert Orientation Level: Disoriented to time;Disoriented to situation Cognition Arousal/Alertness: Awake/alert Overall Cognitive Status: Appears within functional limits for tasks assessed Orientation Level: Disoriented to time;Disoriented to situation  Blood pressure 176/85, pulse 83, temperature 98 F (36.7 C), temperature source Oral, resp. rate 20, height 5\' 4"  (1.626 m), weight 86.183 kg (190 lb), SpO2 97.00%. Physical Exam  Constitutional: Suzanne Howard is oriented to person, place, and time. Suzanne Howard appears well-developed and well-nourished.  HENT:  Head: Normocephalic and atraumatic.  Eyes: Pupils are equal, round, and reactive to light.  Neck: Normal range of motion. Neck supple.  Cardiovascular: Normal rate and regular rhythm.   Pulmonary/Chest: Effort normal and breath sounds normal.  Abdominal: Soft. Bowel sounds are normal.  Neurological: Suzanne Howard is alert and oriented to person, place, and time.       Strength 3-4/5 in the lower ext. DTR's 1+. No gross sensory abnormalities. ue's fairly functional with movement.  FMC fair. Cognitively intact  Skin: Skin is warm and dry.       Back wound dressed. Wearing TLSO  Psychiatric: Suzanne Howard has a normal mood and affect. Her behavior is normal.  Thought content normal.  No results found for this or any previous visit (from the past 24 hour(s)). Dg Thoracic Spine 1 View  05/03/2011  *RADIOLOGY REPORT*  Clinical Data: Revision of T8-11 fusion.  THORACIC SPINE - 1 VIEW  Comparison: Plain pills of the thoracic spine 04/01/2011 and MRI thoracic spine 04/14/2011.  Findings: We are provided two fluoroscopic spot views of the lower thoracic spine.  Pedicle screws are in place from T8-L1. There may be additional more inferior pedicle screws.  Stabilization bar on the right at T12-L1 is noted.  IMPRESSION: Revision of lower thoracic fusion as described.  Original Report Authenticated By: Bernadene Bell. Maricela Curet, M.D.    Assessment/Plan: Diagnosis: thoracic spondylosis and stenosis with myelopathy 1. Does the need for close, 24 hr/day medical supervision in concert with the patient's rehab needs make it unreasonable for this patient to be served in a less intensive setting? Potentially 2. Co-Morbidities requiring supervision/potential complications: htn, multiple back surgeries 3. Due to bladder management, bowel management, safety, skin/wound care, disease management, medication administration, pain management and patient education, does the patient require 24 hr/day rehab nursing? Potentially 4. Does the patient require coordinated care of a physician, rehab nurse, PT (1-2 hrs/day, 5 days/week) and OT (1-2 hrs/day, 5 days/week) to address physical and functional deficits in the context of the above medical diagnosis(es)? Potentially Addressing deficits in the following areas: balance, endurance, locomotion, strength, transferring, bowel/bladder control, bathing, dressing, feeding, grooming and toileting 5. Can the patient actively participate in an intensive therapy program of at least 3 hrs of therapy per day at least 5 days per week? Yes 6. The potential for patient to make measurable gains while on inpatient rehab is good and fair 7. Anticipated  functional outcomes upon discharge from inpatients are mod I PT, mod I to min assist with OT, n/a with SLP 8. Estimated rehab length of stay to reach the above functional goals is: 1 week if needed 9. Does the patient have adequate social supports to accommodate these discharge functional goals? Yes 10. Anticipated D/C setting: Home 11. Anticipated post D/C treatments: HH therapy 12. Overall Rehab/Functional Prognosis: excellent  RECOMMENDATIONS: This patient's condition is appropriate for continued rehabilitative care in the following setting: CIR vs HH PT Patient has agreed to participate in recommended program. Yes Note that insurance prior authorization may be required for reimbursement for recommended care.  Comment: Pt would like to come to inpatient rehab.  Suzanne Howard does have a flight of stairs Suzanne Howard has to navigate within the home.  Suzanne Howard is fairly high level post op day 2.  Would like to see therapy assessments today. If Suzanne Howard shows substantial progress from yesterday Suzanne Howard might be able to go directly home. Rehab RN to follow up.   Ivory Broad, MD 05/05/2011

## 2011-05-05 NOTE — Progress Notes (Signed)
I met with patient, spouse, and son at bedside. We recommend home with home health but patient today feels like she is not ready to go home. She is open to SNF at [facility] if needed. I have contacted SW and she is aware. I told patient that if she changes her mind, she can always opt to d/c home with home health services even though SNF search has begun. Please call for any questions. Pager [phone]. Patient's insurance also will not approve CIR at this time.

## 2011-05-05 NOTE — Progress Notes (Signed)
Occupational Therapy Treatment Patient Details Name: Suzanne Howard MRN: [id number] DOB: 1924-08-19 Today's Date: 05/05/2011  OT Assessment/Plan OT Assessment/Plan Comments on Treatment Session: Pt reporting that she is concerned about going up the stairs when she returns home and questions if she should go to SNF.  OT feels that pt will be able to safely return home but will continue to discuss with pt.  OT Plan: Discharge plan remains appropriate OT Frequency: Min 2X/week Follow Up Recommendations: Home health OT Equipment Recommended: Tub/shower seat OT Goals ADL Goals Pt Will Transfer to Toilet: with modified independence;with DME;Ambulation;3-in-1 ADL Goal: Toilet Transfer - Progress: Goal set today  OT Treatment Precautions/Restrictions  Precautions Precautions: Back Precaution Booklet Issued: Yes (comment) Precaution Comments: Reviewed back precaution and use of brace Spinal Brace: Thoracolumbosacral orthotic;Applied in sitting position   ADL ADL Grooming: Performed;Brushing hair;Modified independent Where Assessed - Grooming: Sitting, chair Toilet Transfer: Simulated;Minimal assistance Toilet Transfer Details (indicate cue type and reason): chair to bed.  cueing for safe hand placement. Toilet Transfer Method: Surveyor, minerals: Other (comment) (bed) Equipment Used: Rolling walker ADL Comments: Pt doffed TLSO with mod assist sitting EOB. Mobility  Bed Mobility Bed Mobility: Yes Sit to Sidelying Left: 4: Min assist Sit to Sidelying Left Details (indicate cue type and reason): cueing for sequencing and technique.  min assist to bil. LE into bed. Transfers Transfers: Yes Sit to Stand: 4: Min assist;From chair/3-in-1;With armrests;With upper extremity assist Sit to Stand Details (indicate cue type and reason): cueing for safe hand placement.  Assist for initiation. Stand to Sit: To bed;With upper extremity assist;Other (comment) (min guard  assist) Stand to Sit Details: cueing for safe hand placement Exercises    End of Session OT - End of Session Equipment Utilized During Treatment: Back brace Activity Tolerance: Patient limited by fatigue Patient left: in bed;with call bell in reach General Behavior During Session: [facility] for tasks performed Cognition: [facility] for tasks performed   4:06 PM 05/05/2011 Cipriano Mile OTR/L Pager [phone] Office [phone]

## 2011-05-05 NOTE — Progress Notes (Signed)
Physical Therapy Treatment Patient Details Name: Suzanne Howard MRN: [id number] DOB: 06-17-1924 Today's Date: 05/05/2011  PT Assessment/Plan  PT - Assessment/Plan Comments on Treatment Session: Patient presents with decreased asssitance required for functional mobility, but unable ot increase distance ambulated secondary to fatigue (patient relates to poor nights sleep). PT Plan: Discharge plan remains appropriate;Frequency remains appropriate PT Frequency: Min 5X/week Follow Up Recommendations: Home health PT;Supervision for mobility/OOB PT Goals  Acute Rehab PT Goals PT Goal: Rolling Supine to Left Side - Progress: Progressing toward goal PT Goal: Supine/Side to Sit - Progress: Progressing toward goal PT Goal: Sit to Stand - Progress: Progressing toward goal PT Goal: Stand to Sit - Progress: Progressing toward goal PT Goal: Ambulate - Progress: Progressing toward goal Additional Goals PT Goal: Additional Goal #1 - Progress: Progressing toward goal  PT Treatment Precautions/Restrictions  Precautions Precautions: Back Precaution Booklet Issued: Yes (comment) Precaution Comments: Reviewed back precaution and use of brace Required Braces or Orthoses: Yes Spinal Brace: Thoracolumbosacral orthotic;Applied in sitting position Mobility (including Balance) Bed Mobility Rolling Left: 4: Min assist Rolling Left Details (indicate cue type and reason): Verbal cues for log roll technique Left Sidelying to Sit: 4: Min assist Left Sidelying to Sit Details (indicate cue type and reason): Improved initiation today despite feeling tired from lack of sleep. Verbal cues to sequnce lower extremities and trunk. Sitting - Scoot to Edge of Bed: 5: Supervision Transfers Sit to Stand: 4: Min assist;From chair/3-in-1;From bed;With upper extremity assist;With armrests Sit to Stand Details (indicate cue type and reason): Tactile and verbal cues for correct hand placement to and from walker. Improved  initiation. Stand to Sit: 4: Min assist;With upper extremity assist;With armrests;To chair/3-in-1 Stand to Sit Details: Improved control of descent - min-guard assistance. Stand Pivot Transfers: 4: Min assist Stand Pivot Transfer Details (indicate cue type and reason): With walker from bed to chair - difficulty negotiating tight space with walker Ambulation/Gait Ambulation/Gait Assistance: 4: Min assist Ambulation/Gait Assistance Details (indicate cue type and reason): Min-guard assistance. Verbal cues for posture and to stay a safe distance from walker at all times. Ambulation Distance (Feet): 40 Feet Assistive device: Rolling walker Gait Pattern: Trunk flexed;Step-through pattern;Decreased stride length    End of Session PT - End of Session Equipment Utilized During Treatment: Gait belt;Back brace Activity Tolerance: Patient limited by fatigue Patient left: in chair;with call bell in reach Nurse Communication: Mobility status for transfers;Mobility status for ambulation General Behavior During Session: [facility] for tasks performed Cognition: [facility] for tasks performed Encourage OOB for each meal.  Edwyna Perfect, PT  Pager [phone]  05/05/2011, 10:24 AM

## 2011-05-06 MED ORDER — CLOPIDOGREL BISULFATE 75 MG PO TABS
75.0000 mg | ORAL_TABLET | Freq: Every day | ORAL | Status: DC
  Administered 2011-05-06 – 2011-05-09 (×4): 75 mg via ORAL
  Filled 2011-05-06 (×4): qty 1

## 2011-05-06 NOTE — Discharge Summary (Addendum)
Physician Discharge Summary  Patient ID: Suzanne Howard MRN: [id number] DOB/AGE: Feb 05, 1925 78 y.o.  Admit date: 05/03/2011 Discharge date: 05/06/2011  Admission Diagnoses:Thoracic stenosis, Thoracic spondylosis with myelopathy, Prior fusion   Discharge Diagnoses: Thoracic stenosis, Thoracic spondylosis with myelopathy, Prior fusion  Active Problems:  * No active hospital problems. *    Discharged Condition: good  Hospital Course: pt admitted and underwent surgery below.  Pt has been increasing activity - working with PT/OT - and making progress.  Minimal pain , pt ready for d/c to rehab  Consults: rehab  Significant Diagnostic Studies: none  Treatments: surgery: T8-11 redo decompressive laminectomy (3L) ,  POSTERIOR LUMBAR FUSION T8-L1 (5L) With autograft, vitoss , bone marrow aspirate, Segmented pedicle screw fixation T8-L1      Discharge Exam: Blood pressure 151/83, pulse 88, temperature 98.2 F (36.8 C), temperature source Oral, resp. rate 20, height 5\' 4"  (1.626 m), weight 86.183 kg (190 lb), SpO2 96.00%. Wound:c/d/i  Disposition: home with home health  addendum - pt not accepted to inpt rehab - worked with therapies couple more days - now pt ready for d/c home with home health    Signed: Clydene Fake, MD 05/06/2011, 8:16 AM

## 2011-05-06 NOTE — Progress Notes (Signed)
Doing well. C/o appropriate incisional soreness.  No Numbness, tingling, weakness No Nausea /vomiting Amb/ voiding well  Temp:  [97.8 F (36.6 C)-98.2 F (36.8 C)] 98.2 F (36.8 C) (03/22 0200) Pulse Rate:  [80-109] 88  (03/22 0200) Resp:  [10-20] 20  (03/22 0200) BP: (140-167)/(70-84) 151/83 mmHg (03/22 0200) SpO2:  [92 %-96 %] 96 % (03/22 0200) Good strength and sensation Incision CDI  Plan: Increase activity,  Pt ready for d/c to rehab

## 2011-05-06 NOTE — Progress Notes (Signed)
Patient's BP was 173/90. Patient gets metoprolol @ 1000. Suzanne Howard

## 2011-05-06 NOTE — Progress Notes (Signed)
Physical Therapy Treatment Patient Details Name: TAJA PENTLAND MRN: [id number] DOB: 05/18/1924 Today's Date: 05/06/2011  PT Assessment/Plan  PT - Assessment/Plan Comments on Treatment Session: Patient lethargic this afternoon, reports having first BM since hospital admission just prior to PT arrival with current stomach cramping. Per chart, patient to d/c to CIR today. Focus of session on reviewing back precautions and practicing safe bed mobility, sidelying <-> sit, and pressure relief techniques. Patient able to verbalize 3 of 3 back precautions, however requires min verbal cues to maintain emergently during mobility. If patient does not d/c to CIR today, acute PT will continue to follow. PT Plan: Discharge plan remains appropriate;Frequency remains appropriate PT Goals  Acute Rehab PT Goals PT Goal: Rolling Supine to Right Side - Progress: Progressing toward goal PT Goal: Rolling Supine to Left Side - Progress: Progressing toward goal PT Goal: Supine/Side to Sit - Progress: Progressing toward goal PT Goal: Sit to Supine/Side - Progress: Progressing toward goal PT Goal: Sit to Stand - Progress: Progressing toward goal PT Goal: Stand to Sit - Progress: Progressing toward goal PT Goal: Ambulate - Progress: Progressing toward goal PT Goal: Up/Down Stairs - Progress: Progressing toward goal Additional Goals PT Goal: Additional Goal #1 - Progress: Progressing toward goal  PT Treatment Precautions/Restrictions  Precautions Precautions: Back;Fall Precaution Booklet Issued: Yes (comment) Precaution Comments: Reviewed back precaution and use of brace Required Braces or Orthoses: Yes Spinal Brace: Thoracolumbosacral orthotic;Applied in sitting position Restrictions Weight Bearing Restrictions: No Mobility (including Balance) Bed Mobility Rolling Left: 4: Min assist;With rail Rolling Left Details (indicate cue type and reason): focus on sequencing and log roll technique to maintain  back precautions and increase functional independence x 3 trials to simmulate OOB at home Left Sidelying to Sit: 3: Mod assist Left Sidelying to Sit Details (indicate cue type and reason): focus on sequencing and log roll technique to maintain back precautions and increase functional independence x 3 trials to simmulate OOB at home      End of Session PT - End of Session Activity Tolerance: Patient limited by fatigue Patient left: in bed;with call bell in reach General Behavior During Session: [facility] for tasks performed Cognition: [facility] for tasks performed  Romeo Rabon 05/06/2011, 1:29 PM

## 2011-05-07 NOTE — Progress Notes (Signed)
Subjective: Patient reports fell so much better  Objective: Vital signs in last 24 hours: Temp:  [97.3 F (36.3 C)-98.9 F (37.2 C)] 98.3 F (36.8 C) (03/23 1025) Pulse Rate:  [61-103] 101  (03/23 1025) Resp:  [16-20] 18  (03/23 1025) BP: (138-175)/(77-86) 138/77 mmHg (03/23 1025) SpO2:  [92 %-98 %] 92 % (03/23 1025)  Intake/Output from previous day: 03/22 0701 - 03/23 0700 In: 960 [P.O.:960] Out: -  Intake/Output this shift: Total I/O In: 360 [P.O.:360] Out: -   moving all extremities well. dressing dry and intact. 5/5 lower extremities.  Lab Results: No results found for this basename: WBC:2,HGB:2,HCT:2,PLT:2 in the last 72 hours BMET No results found for this basename: NA:2,K:2,CL:2,CO2:2,GLUCOSE:2,BUN:2,CREATININE:2,CALCIUM:2 in the last 72 hours  Studies/Results: No results found.  Assessment/Plan: D/C Monday. Doing better.  LOS: 4 days     Deforrest Bogle L 05/07/2011, 10:43 AM

## 2011-05-07 NOTE — Progress Notes (Signed)
Physical Therapy Treatment Patient Details Name: Suzanne Howard MRN: [id number] DOB: 1924-12-25 Today's Date: 05/07/2011  PT Assessment/Plan  PT - Assessment/Plan Comments on Treatment Session: Pt much better per chart review. Practiced stairs, pt able to ambulate farther however requried 2 total seated rests during session. Pt continues to struggle to remember back precautions with functional activity. Pt reports husband is moving her bed to first floor so she will not have to ascend flight of stairs at home.  PT Plan: Discharge plan remains appropriate;Frequency remains appropriate PT Frequency: Min 5X/week Follow Up Recommendations: Home health PT;Supervision for mobility/OOB Equipment Recommended: Tub/shower seat PT Goals  Acute Rehab PT Goals Pt will Roll Supine to Left Side: with supervision PT Goal: Rolling Supine to Left Side - Progress: Met Pt will go Supine/Side to Sit: with supervision PT Goal: Supine/Side to Sit - Progress: Met Pt will go Sit to Stand: with supervision;with upper extremity assist PT Goal: Sit to Stand - Progress: Progressing toward goal Pt will go Stand to Sit: with supervision;with upper extremity assist PT Goal: Stand to Sit - Progress: Progressing toward goal Pt will Ambulate: >150 feet;with supervision;with least restrictive assistive device PT Goal: Ambulate - Progress: Progressing toward goal Pt will Go Up / Down Stairs: Flight;with supervision;with rail(s) PT Goal: Up/Down Stairs - Progress: Progressing toward goal Additional Goals Additional Goal #1: Patient will recall 3/3 back precautions and demonstrate adherence to in functional activity PT Goal: Additional Goal #1 - Progress: Progressing toward goal  PT Treatment Precautions/Restrictions  Precautions Precautions: Back;Fall Precaution Booklet Issued: Yes (comment) Precaution Comments: Reviewed back precaution and use of brace. Max assist to Howard brace Required Braces or Orthoses:  Yes Spinal Brace: Thoracolumbosacral orthotic;Applied in sitting position Restrictions Weight Bearing Restrictions: No Mobility (including Balance) Bed Mobility Rolling Left: 5: Supervision Rolling Left Details (indicate cue type and reason): Verbal cues for log roll technique, no assist needed.  Left Sidelying to Sit: 5: Supervision Left Sidelying to Sit Details (indicate cue type and reason): Verbal cues for back precautions. increased time required.  Sitting - Scoot to Edge of Bed: 5: Supervision Transfers Transfers: Yes Sit to Stand: From chair/3-in-1;From bed;With upper extremity assist;Other (comment);With armrests (min-guard assist) Sit to Stand Details (indicate cue type and reason): min-guard assist for safety. Cues requried for safe UE placement. performed 3x.  Stand to Sit: With upper extremity assist;With armrests;To chair/3-in-1;Other (comment) (min-guard assist) Stand to Sit Details: cueing for safe hand placement. performed 3x.  Ambulation/Gait Ambulation/Gait Assistance: Other (comment) (min-guard assist) Ambulation/Gait Assistance Details (indicate cue type and reason): verbal and tactile cues for improved upright posture and safe distancing of RW. Pt requires cues for RW placement ~every 10'.  Ambulation Distance (Feet): 120 Feet (seated rest in middle) Assistive device: Rolling walker Gait Pattern: Trunk flexed;Step-through pattern;Decreased stride length Stairs: Yes Stairs Assistance: Other (comment) (min-guard assist) Stairs Assistance Details (indicate cue type and reason): Demonstration then verbal cues for safe sequencing. Pt returning demonstration well however has difficulty remembering not to twist spnie.  Stair Management Technique: Two rails;Step to pattern;Forwards Number of Stairs: 4  Height of Stairs: 6   Posture/Postural Control Posture/Postural Control: No significant limitations Balance Balance Assessed: Yes Static Standing Balance Static Standing  - Balance Support: No upper extremity supported;During functional activity (waist against sink) Static Standing - Level of Assistance: 4: Min assist Static Standing - Comment/# of Minutes: Up to min assist for maintaining balance. Cues for maintaining back precautions with functional activity.  End of Session PT - End  of Session Equipment Utilized During Treatment: Gait belt;Back brace Activity Tolerance: Patient limited by fatigue;Patient tolerated treatment well Patient left: with call bell in reach;in chair Nurse Communication: Mobility status for ambulation General Behavior During Session: [facility] for tasks performed Cognition: [facility] for tasks performed  Suzanne Howard 05/07/2011, 4:57 PM  Suzanne Howard) Suzanne Howard PT, DPT Acute Rehabilitation [phone]

## 2011-05-08 NOTE — Progress Notes (Signed)
Subjective: Patient reports improving well.  Looking forward to going home tomorrow.  Objective: Vital signs in last 24 hours: Temp:  [97.4 F (36.3 C)-98.3 F (36.8 C)] 97.8 F (36.6 C) (03/24 0224) Pulse Rate:  [89-101] 89  (03/24 0224) Resp:  [18-20] 20  (03/24 0224) BP: (136-161)/(71-84) 136/71 mmHg (03/24 0224) SpO2:  [92 %-96 %] 96 % (03/24 0224)  Intake/Output from previous day: 03/23 0701 - 03/24 0700 In: 1160 [P.O.:1160] Out: 1 [Urine:1] Intake/Output this shift:    Physical Exam: Wearing TLSO.  Strength full in both lower extremities.  Lab Results: No results found for this basename: WBC:2,HGB:2,HCT:2,PLT:2 in the last 72 hours BMET No results found for this basename: NA:2,K:2,CL:2,CO2:2,GLUCOSE:2,BUN:2,CREATININE:2,CALCIUM:2 in the last 72 hours  Studies/Results: No results found.  Assessment/Plan: Continue PT today.  Anticipate D/C in am.    LOS: 5 days    Dorian Heckle, MD 05/08/2011, 7:26 AM

## 2011-05-09 MED ORDER — HYDROCODONE-ACETAMINOPHEN 5-500 MG PO TABS: 1.0000 | ORAL_TABLET | ORAL | Status: AC | PRN

## 2011-05-09 MED FILL — Heparin Sodium (Porcine) Inj 1000 Unit/ML: INTRAMUSCULAR | Qty: 30 | Status: AC

## 2011-05-09 MED FILL — Sodium Chloride IV Soln 0.9%: INTRAVENOUS | Qty: 1000 | Status: AC

## 2011-05-09 MED FILL — Sodium Chloride Irrigation Soln 0.9%: Qty: 3000 | Status: AC

## 2011-05-09 NOTE — Progress Notes (Signed)
Physical Therapy Treatment Patient Details Name: Suzanne Howard MRN: [id number] DOB: 1924-12-04 Today's Date: 05/09/2011  PT Assessment/Plan  PT - Assessment/Plan Comments on Treatment Session: Pt doing very well. Still with some decreased memory of back precautions, continues to allow RW to advance too far anteriorly however is mobilizing well and excited to go home. Advised to utilize son-in-law with transfer into house for safety. PT Plan: Discharge plan remains appropriate;Frequency remains appropriate PT Frequency: Min 5X/week Follow Up Recommendations: Home health PT;Supervision for mobility/OOB Equipment Recommended: Tub/shower seat PT Goals  Acute Rehab PT Goals Pt will go Sit to Stand: with supervision;with upper extremity assist PT Goal: Sit to Stand - Progress: Met Pt will go Stand to Sit: with supervision;with upper extremity assist PT Goal: Stand to Sit - Progress: Met Pt will Ambulate: >150 feet;with supervision;with least restrictive assistive device PT Goal: Ambulate - Progress: Met Pt will Go Up / Down Stairs: Flight;with supervision;with rail(s) PT Goal: Up/Down Stairs - Progress: Progressing toward goal Additional Goals Additional Goal #1: Patient will recall 3/3 back precautions and demonstrate adherence to in functional activity PT Goal: Additional Goal #1 - Progress: Progressing toward goal  PT Treatment Precautions/Restrictions  Precautions Precautions: Back;Fall Precaution Booklet Issued: Yes (comment) Precaution Comments: Reviewed back precaution and use of brace. Pt continues to require verbal cues for recall of precautions however much improved with adherence during functional activity.  Required Braces or Orthoses: Yes Spinal Brace: Thoracolumbosacral orthotic Restrictions Weight Bearing Restrictions: No Mobility (including Balance) Bed Mobility Bed Mobility: No (seated at start) Transfers Transfers: Yes Sit to Stand: From chair/3-in-1;With  upper extremity assist;With armrests;5: Supervision Sit to Stand Details (indicate cue type and reason): Supervision for safety. Pt requires 2 attempts to "get going" No physical assist needed. Performed 2x.  Stand to Sit: 5: Supervision;With upper extremity assist;With armrests;To chair/3-in-1 Stand to Sit Details: Supervision for safety. Performed 2x. Cues for UE placement only needed with sitting to 3n1. Ambulation/Gait Ambulation/Gait: Yes Ambulation/Gait Assistance: 5: Supervision Ambulation/Gait Assistance Details (indicate cue type and reason): Repeated verbal cues to encourage improved upright posture and closer placement of RW. Cues to decrease reliance on RW.  Ambulation Distance (Feet): 150 Feet (continuously) Assistive device: Rolling walker Gait Pattern: Trunk flexed;Step-through pattern;Decreased stride length Stairs: No (pt wants to reserve energy)  Posture/Postural Control Posture/Postural Control: No significant limitations Balance Balance Assessed: Yes Static Standing Balance Static Standing - Balance Support: No upper extremity supported;During functional activity (waist against sink) Static Standing - Level of Assistance: 5: Stand by assistance Static Standing - Comment/# of Minutes: Cues for safe placement of RW for optimal positioning while performing ADLs End of Session PT - End of Session Equipment Utilized During Treatment: Gait belt;Back brace Activity Tolerance: Patient limited by fatigue;Patient tolerated treatment well Patient left: with call bell in reach;in chair Nurse Communication: Mobility status for ambulation General Behavior During Session: [facility] for tasks performed Cognition: [facility] for tasks performed  [name] 05/09/2011, 12:29 PM  [name]) [name] PT, DPT Acute Rehabilitation [phone]

## 2011-05-09 NOTE — Progress Notes (Signed)
05/09/11 11:00 Care Management note:  Shower stool ordered for patient.

## 2011-05-09 NOTE — Progress Notes (Signed)
Occupational Therapy Treatment Patient Details Name: Suzanne Howard MRN: [id number] DOB: Apr 13, 1924 Today's Date: 05/09/2011  OT Assessment/Plan OT Assessment/Plan Comments on Treatment Session: pt. reports bed has been brought downstairs and is ready for her when she d/cs home OT Plan: Discharge plan remains appropriate OT Frequency: Min 2X/week Follow Up Recommendations: Home health OT OT Goals Acute Rehab OT Goals Time For Goal Achievement: 7 days ADL Goals ADL Goal: Upper Body Dressing - Progress: Progressing toward goals ADL Goal: Toilet Transfer - Progress: Progressing toward goals ADL Goal: Toileting - Clothing Manipulation - Progress: Progressing toward goals ADL Goal: Toileting - Hygiene - Progress: Progressing toward goals  OT Treatment Precautions/Restrictions  Precautions Precautions: Back;Fall Precaution Comments: Reviewed back precaution and use of brace. Pt continues to require verbal cues for recall of precautions however much improved with adherence during functional activity.  Required Braces or Orthoses: Yes Spinal Brace: Thoracolumbosacral orthotic Restrictions Weight Bearing Restrictions: No   ADL ADL Grooming: Performed;Wash/dry hands;Supervision/safety Grooming Details (indicate cue type and reason): remembered proper walker positioning at counter without cueing Where Assessed - Grooming: Standing at sink Toilet Transfer: Research scientist (life sciences) Method: Proofreader: Raised toilet seat with arms (or 3-in-1 over toilet);Grab bars Toileting - Clothing Manipulation: Performed;Supervision/safety Where Assessed - Toileting Clothing Manipulation: Sit to stand from 3-in-1 or toilet Toileting - Hygiene: Performed;Supervision/safety Where Assessed - Toileting Hygiene: Sit to stand from 3-in-1 or toilet Ambulation Related to ADLs: able to manipulate walker safetly in the room Mobility  Bed Mobility Bed  Mobility: No Transfers Transfers: Yes Sit to Stand: From chair/3-in-1;With upper extremity assist;With armrests;5: Supervision Sit to Stand Details (indicate cue type and reason): Supervision for safety. Pt requires 2 attempts to "get going" No physical assist needed. Performed 2x.  Stand to Sit: 5: Supervision;With upper extremity assist;With armrests;To chair/3-in-1 Stand to Sit Details: Supervision for safety. Performed 2x. Cues for UE placement only needed with sitting to 3n1. Exercises    End of Session OT - End of Session Equipment Utilized During Treatment: Back brace Activity Tolerance: Patient tolerated treatment well Patient left: in chair;with call bell in reach;with family/visitor present General Behavior During Session: [facility] for tasks performed Cognition: [facility] for tasks performed  Robet Leu COTA/L 05/09/2011, 12:24 PM

## 2011-11-10 ENCOUNTER — Ambulatory Visit: Payer: Medicare Other | Attending: Family Medicine

## 2011-11-10 DIAGNOSIS — R262 Difficulty in walking, not elsewhere classified: Secondary | ICD-10-CM | POA: Insufficient documentation

## 2011-11-10 DIAGNOSIS — R269 Unspecified abnormalities of gait and mobility: Secondary | ICD-10-CM | POA: Insufficient documentation

## 2011-11-10 DIAGNOSIS — IMO0001 Reserved for inherently not codable concepts without codable children: Secondary | ICD-10-CM | POA: Insufficient documentation

## 2011-11-10 DIAGNOSIS — M6281 Muscle weakness (generalized): Secondary | ICD-10-CM | POA: Insufficient documentation

## 2011-11-15 ENCOUNTER — Ambulatory Visit: Payer: Medicare Other | Attending: Family Medicine | Admitting: Physical Therapy

## 2011-11-15 DIAGNOSIS — IMO0001 Reserved for inherently not codable concepts without codable children: Secondary | ICD-10-CM | POA: Insufficient documentation

## 2011-11-15 DIAGNOSIS — R269 Unspecified abnormalities of gait and mobility: Secondary | ICD-10-CM | POA: Insufficient documentation

## 2011-11-15 DIAGNOSIS — R262 Difficulty in walking, not elsewhere classified: Secondary | ICD-10-CM | POA: Insufficient documentation

## 2011-11-15 DIAGNOSIS — M6281 Muscle weakness (generalized): Secondary | ICD-10-CM | POA: Insufficient documentation

## 2011-11-17 ENCOUNTER — Ambulatory Visit: Payer: Medicare Other

## 2011-11-18 ENCOUNTER — Ambulatory Visit: Payer: Medicare Other | Admitting: Physical Therapy

## 2011-11-22 ENCOUNTER — Ambulatory Visit: Payer: Medicare Other

## 2011-11-23 ENCOUNTER — Ambulatory Visit: Payer: Medicare Other

## 2011-11-24 ENCOUNTER — Encounter: Payer: Medicare Other | Admitting: Physical Therapy

## 2011-11-29 ENCOUNTER — Ambulatory Visit: Payer: Medicare Other | Admitting: Physical Therapy

## 2011-12-01 ENCOUNTER — Ambulatory Visit: Payer: Medicare Other | Admitting: Physical Therapy

## 2011-12-06 ENCOUNTER — Ambulatory Visit: Payer: Medicare Other | Admitting: Physical Therapy

## 2011-12-08 ENCOUNTER — Ambulatory Visit: Payer: Medicare Other | Admitting: Physical Therapy

## 2011-12-13 ENCOUNTER — Ambulatory Visit: Payer: Medicare Other

## 2011-12-15 ENCOUNTER — Ambulatory Visit: Payer: Medicare Other | Admitting: Physical Therapy

## 2011-12-27 ENCOUNTER — Other Ambulatory Visit: Payer: Self-pay | Admitting: Family Medicine

## 2011-12-27 DIAGNOSIS — R109 Unspecified abdominal pain: Secondary | ICD-10-CM

## 2011-12-28 ENCOUNTER — Ambulatory Visit
Admission: RE | Admit: 2011-12-28 | Discharge: 2011-12-28 | Disposition: A | Discharge status: Home / Self Care | Payer: Medicare Other | Source: Ambulatory Visit | Attending: Family Medicine | Admitting: Family Medicine

## 2011-12-28 DIAGNOSIS — R109 Unspecified abdominal pain: Secondary | ICD-10-CM

## 2012-03-19 ENCOUNTER — Ambulatory Visit (INDEPENDENT_AMBULATORY_CARE_PROVIDER_SITE_OTHER): Payer: Medicare Other | Admitting: Emergency Medicine

## 2012-03-19 VITALS — BP 140/84 | HR 100 | Temp 98.0°F | Resp 16 | Wt 188.6 lb

## 2012-03-19 DIAGNOSIS — M5430 Sciatica, unspecified side: Secondary | ICD-10-CM

## 2012-03-19 MED ORDER — TRAMADOL HCL 50 MG PO TABS: 50.0000 mg | ORAL_TABLET | Freq: Four times a day (QID) | ORAL | Status: DC | PRN

## 2012-03-19 NOTE — Progress Notes (Signed)
[facility] [address], [place] [address] [phone]  Date:  03/19/2012   Name:  Suzanne Howard   DOB:  03-28-1924   MRN:  [id number]  PCP:  Emeterio Reeve, MD    Chief Complaint: Back Pain   History of Present Illness:  Suzanne Howard is a 79 y.o. very pleasant female patient who presents with the following:  Has pain in sciatic notch worse with coughing and into right inguinal region that was evaluated by sono pelvis.  No history of trauma or overuse.  History of thoracic spine surgery for a fall last year.  No numbness tingling or weakness of leg.  No urine incontinence.  There is no problem list on file for this patient.   Past Medical History  Diagnosis Date  . Hypertension   . Heart murmur     Evaluated by [facility] cardiology last year pt reports no follow up required  . Thoracic stenosis   . Kidney stones   . Stroke 1998    no residual effects  . Thoracic spondylosis with myelopathy   . History of pneumonia     as a child    Past Surgical History  Procedure Date  . Lithotripsy 2012  . Thoracic fusion   . Appendectomy     as a teenager  . Endometrial ablation   . Cataract extraction, bilateral   . Tonsillectomy and adenoidectomy     History  Substance Use Topics  . Smoking status: Never Smoker   . Smokeless tobacco: Never Used  . Alcohol Use: No    Family History  Problem Relation Age of Onset  . Anesthesia problems Neg Hx     Allergies  Allergen Reactions  . Betadine (Povidone Iodine) Itching    Pt can tolerate shrimp and shellfish    Medication list has been reviewed and updated.  Current Outpatient Prescriptions on File Prior to Visit  Medication Sig Dispense Refill  . clopidogrel (PLAVIX) 75 MG tablet Take 75 mg by mouth daily.      . Coenzyme Q10 (CO Q 10 PO) Take 2 tablets by mouth daily.      . cyanocobalamin 100 MCG tablet Take 200 mcg by mouth daily.      . metoprolol succinate (TOPROL-XL) 50 MG  24 hr tablet Take 50 mg by mouth daily. Take with or immediately following a meal.        Review of Systems:  As per HPI, otherwise negative.    Physical Examination: Filed Vitals:   03/19/12 0931  BP: 140/84  Pulse: 100  Temp: 98 F (36.7 C)  Resp: 16   Filed Vitals:   03/19/12 0931  Weight: 188 lb 9.6 oz (85.548 kg)   There is no height on file to calculate BMI. Ideal Body Weight:    GEN: WDWN, NAD, Non-toxic, A & O x 3 HEENT: Atraumatic, Normocephalic. Neck supple. No masses, No LAD. Ears and Nose: No external deformity. CV: RRR, No M/G/R. No JVD. No thrill. No extra heart sounds. PULM: CTA B, no wheezes, crackles, rhonchi. No retractions. No resp. distress. No accessory muscle use. ABD: S, NT, ND, +BS. No rebound. No HSM. EXTR: No c/c/e NEURO Normal gait.  PSYCH: Normally interactive. Conversant. Not depressed or anxious appearing.  Calm demeanor.  BACK:  Tender right sciatic notch.  Neuro grossly intact.  Assessment and Plan: Sciatic neuritis Tramadol Follow up in one month  Carmelina Dane, MD

## 2012-03-19 NOTE — Patient Instructions (Addendum)
Sciatica Sciatica is pain, weakness, numbness, or tingling along the path of the sciatic nerve. The nerve starts in the lower back and runs down the back of each leg. The nerve controls the muscles in the lower leg and in the back of the knee, while also providing sensation to the back of the thigh, lower leg, and the sole of your foot. Sciatica is a symptom of another medical condition. For instance, nerve damage or certain conditions, such as a herniated disk or bone spur on the spine, pinch or put pressure on the sciatic nerve. This causes the pain, weakness, or other sensations normally associated with sciatica. Generally, sciatica only affects one side of the body. CAUSES   Herniated or slipped disc.  Degenerative disk disease.  A pain disorder involving the narrow muscle in the buttocks (piriformis syndrome).  Pelvic injury or fracture.  Pregnancy.  Tumor (rare). SYMPTOMS  Symptoms can vary from mild to very severe. The symptoms usually travel from the low back to the buttocks and down the back of the leg. Symptoms can include:  Mild tingling or dull aches in the lower back, leg, or hip.  Numbness in the back of the calf or sole of the foot.  Burning sensations in the lower back, leg, or hip.  Sharp pains in the lower back, leg, or hip.  Leg weakness.  Severe back pain inhibiting movement. These symptoms may get worse with coughing, sneezing, laughing, or prolonged sitting or standing. Also, being overweight may worsen symptoms. DIAGNOSIS  Your caregiver will perform a physical exam to look for common symptoms of sciatica. He or she may ask you to do certain movements or activities that would trigger sciatic nerve pain. Other tests may be performed to find the cause of the sciatica. These may include:  Blood tests.  X-rays.  Imaging tests, such as an MRI or CT scan. TREATMENT  Treatment is directed at the cause of the sciatic pain. Sometimes, treatment is not necessary  and the pain and discomfort goes away on its own. If treatment is needed, your caregiver may suggest:  Over-the-counter medicines to relieve pain.  Prescription medicines, such as anti-inflammatory medicine, muscle relaxants, or narcotics.  Applying heat or ice to the painful area.  Steroid injections to lessen pain, irritation, and inflammation around the nerve.  Reducing activity during periods of pain.  Exercising and stretching to strengthen your abdomen and improve flexibility of your spine. Your caregiver may suggest losing weight if the extra weight makes the back pain worse.  Physical therapy.  Surgery to eliminate what is pressing or pinching the nerve, such as a bone spur or part of a herniated disk. HOME CARE INSTRUCTIONS   Only take over-the-counter or prescription medicines for pain or discomfort as directed by your caregiver.  Apply ice to the affected area for 20 minutes, 3 4 times a day for the first 48 72 hours. Then try heat in the same way.  Exercise, stretch, or perform your usual activities if these do not aggravate your pain.  Attend physical therapy sessions as directed by your caregiver.  Keep all follow-up appointments as directed by your caregiver.  Do not wear high heels or shoes that do not provide proper support.  Check your mattress to see if it is too soft. A firm mattress may lessen your pain and discomfort. SEEK IMMEDIATE MEDICAL CARE IF:   You lose control of your bowel or bladder (incontinence).  You have increasing weakness in the lower back,   pelvis, buttocks, or legs.  You have redness or swelling of your back.  You have a burning sensation when you urinate.  You have pain that gets worse when you lie down or awakens you at night.  Your pain is worse than you have experienced in the past.  Your pain is lasting longer than 4 weeks.  You are suddenly losing weight without reason. MAKE SURE YOU:  Understand these  instructions.  Will watch your condition.  Will get help right away if you are not doing well or get worse. Document Released: 01/25/2001 Document Revised: 08/02/2011 Document Reviewed: 06/12/2011 [employer] Patient Information 2013 [employer].  

## 2012-04-05 ENCOUNTER — Ambulatory Visit
Admission: RE | Admit: 2012-04-05 | Discharge: 2012-04-05 | Disposition: A | Discharge status: Home / Self Care | Payer: Medicare Other | Source: Ambulatory Visit

## 2012-04-05 ENCOUNTER — Other Ambulatory Visit: Payer: Self-pay

## 2012-04-05 DIAGNOSIS — M549 Dorsalgia, unspecified: Secondary | ICD-10-CM

## 2012-04-05 DIAGNOSIS — R109 Unspecified abdominal pain: Secondary | ICD-10-CM

## 2012-04-30 ENCOUNTER — Ambulatory Visit (INDEPENDENT_AMBULATORY_CARE_PROVIDER_SITE_OTHER): Payer: Medicare Other | Admitting: Internal Medicine

## 2012-04-30 VITALS — BP 170/100 | HR 77 | Temp 97.8°F | Resp 18 | Ht 60.75 in | Wt 182.4 lb

## 2012-04-30 LAB — POCT URINALYSIS DIPSTICK
Bilirubin, UA: NEGATIVE
Blood, UA: NEGATIVE
Glucose, UA: NEGATIVE
Leukocytes, UA: NEGATIVE
Nitrite, UA: NEGATIVE
Urobilinogen, UA: 0.2

## 2012-04-30 LAB — POCT UA - MICROSCOPIC ONLY: Crystals, Ur, HPF, POC: NEGATIVE

## 2012-04-30 MED ORDER — GABAPENTIN 100 MG PO CAPS: 100.0000 mg | ORAL_CAPSULE | Freq: Three times a day (TID) | ORAL | Status: DC

## 2012-04-30 NOTE — Patient Instructions (Addendum)
Constipation, Adult Constipation is when a person has fewer than 3 bowel movements a week; has difficulty having a bowel movement; or has stools that are dry, hard, or larger than normal. As people grow older, constipation is more common. If you try to fix constipation with medicines that make you have a bowel movement (laxatives), the problem may get worse. Long-term laxative use may cause the muscles of the colon to become weak. A low-fiber diet, not taking in enough fluids, and taking certain medicines may make constipation worse. CAUSES   Certain medicines, such as antidepressants, pain medicine, iron supplements, antacids, and water pills.   Certain diseases, such as diabetes, irritable bowel syndrome (IBS), thyroid disease, or depression.   Not drinking enough water.   Not eating enough fiber-rich foods.   Stress or travel.  Lack of physical activity or exercise.  Not going to the restroom when there is the urge to have a bowel movement.  Ignoring the urge to have a bowel movement.  Using laxatives too much. SYMPTOMS   Having fewer than 3 bowel movements a week.   Straining to have a bowel movement.   Having hard, dry, or larger than normal stools.   Feeling full or bloated.   Pain in the lower abdomen.  Not feeling relief after having a bowel movement. DIAGNOSIS  Your caregiver will take a medical history and perform a physical exam. Further testing may be done for severe constipation. Some tests may include:   A barium enema X-ray to examine your rectum, colon, and sometimes, your small intestine.  A sigmoidoscopy to examine your lower colon.  A colonoscopy to examine your entire colon. TREATMENT  Treatment will depend on the severity of your constipation and what is causing it. Some dietary treatments include drinking more fluids and eating more fiber-rich foods. Lifestyle treatments may include regular exercise. If these diet and lifestyle recommendations  do not help, your caregiver may recommend taking over-the-counter laxative medicines to help you have bowel movements. Prescription medicines may be prescribed if over-the-counter medicines do not work.  HOME CARE INSTRUCTIONS   Increase dietary fiber in your diet, such as fruits, vegetables, whole grains, and beans. Limit high-fat and processed sugars in your diet, such as [place] fries, hamburgers, cookies, candies, and soda.   A fiber supplement may be added to your diet if you cannot get enough fiber from foods.   Drink enough fluids to keep your urine clear or pale yellow.   Exercise regularly or as directed by your caregiver.   Go to the restroom when you have the urge to go. Do not hold it.  Only take medicines as directed by your caregiver. Do not take other medicines for constipation without talking to your caregiver first. SEEK IMMEDIATE MEDICAL CARE IF:   You have bright red blood in your stool.   Your constipation lasts for more than 4 days or gets worse.   You have abdominal or rectal pain.   You have thin, pencil-like stools.  You have unexplained weight loss. MAKE SURE YOU:   Understand these instructions.  Will watch your condition.  Will get help right away if you are not doing well or get worse. Document Released: 10/30/2003 Document Revised: 04/25/2011 Document Reviewed: 01/04/2011 [facility] Patient Information 2013 [place], [place]. Neuropathic Pain We often think that pain has a physical cause. If we get rid of the cause, the pain should go away. Nerves themselves can also cause pain. It is called neuropathic pain, which means  nerve abnormality. It may be difficult for the patients who have it and for the treating caregivers. Pain is usually described as acute (short-lived) or chronic (long-lasting). Acute pain is related to the physical sensations caused by an injury. It can last from a few seconds to many weeks, but it usually goes away when normal  healing occurs. Chronic pain lasts beyond the typical healing time. With neuropathic pain, the nerve fibers themselves may be damaged or injured. They then send incorrect signals to other pain centers. The pain you feel is real, but the cause is not easy to find.  CAUSES  Chronic pain can result from diseases, such as diabetes and shingles (an infection related to chickenpox), or from trauma, surgery, or amputation. It can also happen without any known injury or disease. The nerves are sending pain messages, even though there is no identifiable cause for such messages.   Other common causes of neuropathy include diabetes, phantom limb pain, or Regional Pain Syndrome (RPS).  As with all forms of chronic back pain, if neuropathy is not correctly treated, there can be a number of associated problems that lead to a downward cycle for the patient. These include depression, sleeplessness, feelings of fear and anxiety, limited social interaction and inability to do normal daily activities or work.  The most dramatic and mysterious example of neuropathic pain is called "phantom limb syndrome." This occurs when an arm or a leg has been removed because of illness or injury. The brain still gets pain messages from the nerves that originally carried impulses from the missing limb. These nerves now seem to misfire and cause troubling pain.  Neuropathic pain often seems to have no cause. It responds poorly to standard pain treatment. Neuropathic pain can occur after:  Shingles (herpes zoster virus infection).  A lasting burning sensation of the skin, caused usually by injury to a peripheral nerve.  Peripheral neuropathy which is widespread nerve damage, often caused by diabetes or alcoholism.  Phantom limb pain following an amputation.  Facial nerve problems (trigeminal neuralgia).  Multiple sclerosis.  Reflex sympathetic dystrophy.  Pain which comes with cancer and cancer chemotherapy.  Entrapment  neuropathy such as when pressure is put on a nerve such as in carpal tunnel syndrome.  Back, leg, and hip problems (sciatica).  Spine or back surgery.  HIV Infection or AIDS where nerves are infected by viruses. Your caregiver can explain items in the above list which may apply to you. SYMPTOMS  Characteristics of neuropathic pain are:  Severe, sharp, electric shock-like, shooting, lightening-like, knife-like.  Pins and needles sensation.  Deep burning, deep cold, or deep ache.  Persistent numbness, tingling, or weakness.  Pain resulting from light touch or other stimulus that would not usually cause pain.  Increased sensitivity to something that would normally cause pain, such as a pinprick. Pain may persist for months or years following the healing of damaged tissues. When this happens, pain signals no longer sound an alarm about current injuries or injuries about to happen. Instead, the alarm system itself is not working correctly.  Neuropathic pain may get worse instead of better over time. For some people, it can lead to serious disability. It is important to be aware that severe injury in a limb can occur without a proper, protective pain response.Burns, cuts, and other injuries may go unnoticed. Without proper treatment, these injuries can become infected or lead to further disability. Take any injury seriously, and consult your caregiver for treatment. DIAGNOSIS  When you  have a pain with no known cause, your caregiver will probably ask some specific questions:   Do you have any other conditions, such as diabetes, shingles, multiple sclerosis, or HIV infection?  How would you describe your pain? (Neuropathic pain is often described as shooting, stabbing, burning, or searing.)  Is your pain worse at any time of the day? (Neuropathic pain is usually worse at night.)  Does the pain seem to follow a certain physical pathway?  Does the pain come from an area that has missing or  injured nerves? (An example would be phantom limb pain.)  Is the pain triggered by minor things such as rubbing against the sheets at night? These questions often help define the type of pain involved. Once your caregiver knows what is happening, treatment can begin. Anticonvulsant, antidepressant drugs, and various pain relievers seem to work in some cases. If another condition, such as diabetes is involved, better management of that disorder may relieve the neuropathic pain.  TREATMENT  Neuropathic pain is frequently long-lasting and tends not to respond to treatment with narcotic type pain medication. It may respond well to other drugs such as antiseizure and antidepressant medications. Usually, neuropathic problems do not completely go away, but partial improvement is often possible with proper treatment. Your caregivers have large numbers of medications available to treat you. Do not be discouraged if you do not get immediate relief. Sometimes different medications or a combination of medications will be tried before you receive the results you are hoping for. See your caregiver if you have pain that seems to be coming from nowhere and does not go away. Help is available.  SEEK IMMEDIATE MEDICAL CARE IF:   There is a sudden change in the quality of your pain, especially if the change is on only one side of the body.  You notice changes of the skin, such as redness, black or purple discoloration, swelling, or an ulcer.  You cannot move the affected limbs. Document Released: 10/29/2003 Document Revised: 04/25/2011 Document Reviewed: 10/29/2003 [facility] Patient Information 2013 [place], [place].

## 2012-04-30 NOTE — Progress Notes (Signed)
  Subjective:    Patient ID: Suzanne Howard, female    DOB: 16-Sep-1924, 79 y.o.   MRN: [id number]  HPI Has burning pain right LB and flank for 3 weeks, no rash seen or present now. Had shingles vaccine over 1 year ago from her PCP. Also 1 year ago had major spinal fusion by Dr. Phoebe Perch, was succeesful and walking well with a cane. Pain does not radiate down right leg, no weakness or numbness assoc. On tramadol and HC and c/o constipation.   Review of Systems     Objective:   Physical Exam  Constitutional: She is oriented to person, place, and time. She appears well-nourished. No distress.  Eyes: EOM are normal.  Pulmonary/Chest: Effort normal.  Musculoskeletal: She exhibits tenderness.       Arms: Area of burning pain, no rash visible  Neurological: She is alert and oriented to person, place, and time. She has normal strength. No cranial nerve deficit or sensory deficit. She exhibits normal muscle tone. Coordination and gait normal.  Skin: No rash noted.  Psychiatric: She has a normal mood and affect.   Alert/ appears reasonably well/Moves well  ccua Results for orders placed in visit on 04/30/12  POCT URINALYSIS DIPSTICK      Result Value Range   Color, UA yellow     Clarity, UA clear     Glucose, UA neg     Bilirubin, UA neg     Ketones, UA neg     Spec Grav, UA 1.010     Blood, UA neg     pH, UA 5.0     Protein, UA neg     Urobilinogen, UA 0.2     Nitrite, UA neg     Leukocytes, UA Negative    POCT UA - MICROSCOPIC ONLY      Result Value Range   WBC, Ur, HPF, POC 1-5     RBC, urine, microscopic 0-1     Bacteria, U Microscopic trace     Mucus, UA neg     Epithelial cells, urine per micros 2-6     Crystals, Ur, HPF, POC neg     Casts, Ur, LPF, POC neg     Yeast, UA neg         Assessment & Plan:  Probable neuropathic pain Constipation diet and miralx Trial of gabapentin with close f/up by her PCP Dr. Paulino Rily

## 2012-07-23 ENCOUNTER — Other Ambulatory Visit: Payer: Self-pay | Admitting: Physical Medicine and Rehabilitation

## 2012-07-23 DIAGNOSIS — G543 Thoracic root disorders, not elsewhere classified: Secondary | ICD-10-CM

## 2012-08-02 ENCOUNTER — Other Ambulatory Visit: Payer: Medicare Other

## 2012-08-13 ENCOUNTER — Other Ambulatory Visit: Payer: Medicare Other

## 2012-11-28 ENCOUNTER — Other Ambulatory Visit: Payer: Self-pay | Admitting: Physical Medicine and Rehabilitation

## 2012-11-28 DIAGNOSIS — M549 Dorsalgia, unspecified: Secondary | ICD-10-CM

## 2012-12-06 ENCOUNTER — Ambulatory Visit
Admission: RE | Admit: 2012-12-06 | Discharge: 2012-12-06 | Disposition: A | Discharge status: Home / Self Care | Payer: Medicare Other | Source: Ambulatory Visit | Attending: Physical Medicine and Rehabilitation | Admitting: Physical Medicine and Rehabilitation

## 2012-12-06 DIAGNOSIS — M549 Dorsalgia, unspecified: Secondary | ICD-10-CM

## 2012-12-06 MED ORDER — GADOBENATE DIMEGLUMINE 529 MG/ML IV SOLN
8.0000 mL | Freq: Once | INTRAVENOUS | Status: AC | PRN
  Administered 2012-12-06: 8 mL via INTRAVENOUS

## 2013-04-01 ENCOUNTER — Other Ambulatory Visit: Payer: Self-pay | Admitting: Physical Medicine and Rehabilitation

## 2013-04-01 DIAGNOSIS — M549 Dorsalgia, unspecified: Secondary | ICD-10-CM

## 2013-04-08 ENCOUNTER — Ambulatory Visit (INDEPENDENT_AMBULATORY_CARE_PROVIDER_SITE_OTHER): Payer: Medicare Other | Admitting: Emergency Medicine

## 2013-04-08 VITALS — BP 126/68 | HR 97 | Temp 98.3°F | Resp 18 | Ht 63.5 in | Wt 199.8 lb

## 2013-04-08 DIAGNOSIS — J018 Other acute sinusitis: Secondary | ICD-10-CM

## 2013-04-08 DIAGNOSIS — J209 Acute bronchitis, unspecified: Secondary | ICD-10-CM

## 2013-04-08 MED ORDER — AMOXICILLIN-POT CLAVULANATE 875-125 MG PO TABS: 1.0000 | ORAL_TABLET | Freq: Two times a day (BID) | ORAL | Status: DC

## 2013-04-08 MED ORDER — GUAIFENESIN-DM 100-10 MG/5ML PO SYRP: 5.0000 mL | ORAL_SOLUTION | ORAL | Status: DC | PRN

## 2013-04-08 NOTE — Patient Instructions (Signed)
Coricidin HBP    Sinusitis Sinusitis is redness, soreness, and swelling (inflammation) of the paranasal sinuses. Paranasal sinuses are air pockets within the bones of your face (beneath the eyes, the middle of the forehead, or above the eyes). In healthy paranasal sinuses, mucus is able to drain out, and air is able to circulate through them by way of your nose. However, when your paranasal sinuses are inflamed, mucus and air can become trapped. This can allow bacteria and other germs to grow and cause infection. Sinusitis can develop quickly and last only a short time (acute) or continue over a long period (chronic). Sinusitis that lasts for more than 12 weeks is considered chronic.  CAUSES  Causes of sinusitis include:  Allergies.  Structural abnormalities, such as displacement of the cartilage that separates your nostrils (deviated septum), which can decrease the air flow through your nose and sinuses and affect sinus drainage.  Functional abnormalities, such as when the small hairs (cilia) that line your sinuses and help remove mucus do not work properly or are not present. SYMPTOMS  Symptoms of acute and chronic sinusitis are the same. The primary symptoms are pain and pressure around the affected sinuses. Other symptoms include:  Upper toothache.  Earache.  Headache.  Bad breath.  Decreased sense of smell and taste.  A cough, which worsens when you are lying flat.  Fatigue.  Fever.  Thick drainage from your nose, which often is green and may contain pus (purulent).  Swelling and warmth over the affected sinuses. DIAGNOSIS  Your caregiver will perform a physical exam. During the exam, your caregiver may:  Look in your nose for signs of abnormal growths in your nostrils (nasal polyps).  Tap over the affected sinus to check for signs of infection.  View the inside of your sinuses (endoscopy) with a special imaging device with a light attached (endoscope), which is  inserted into your sinuses. If your caregiver suspects that you have chronic sinusitis, one or more of the following tests may be recommended:  Allergy tests.  Nasal culture A sample of mucus is taken from your nose and sent to a lab and screened for bacteria.  Nasal cytology A sample of mucus is taken from your nose and examined by your caregiver to determine if your sinusitis is related to an allergy. TREATMENT  Most cases of acute sinusitis are related to a viral infection and will resolve on their own within 10 days. Sometimes medicines are prescribed to help relieve symptoms (pain medicine, decongestants, nasal steroid sprays, or saline sprays).  However, for sinusitis related to a bacterial infection, your caregiver will prescribe antibiotic medicines. These are medicines that will help kill the bacteria causing the infection.  Rarely, sinusitis is caused by a fungal infection. In theses cases, your caregiver will prescribe antifungal medicine. For some cases of chronic sinusitis, surgery is needed. Generally, these are cases in which sinusitis recurs more than 3 times per year, despite other treatments. HOME CARE INSTRUCTIONS   Drink plenty of water. Water helps thin the mucus so your sinuses can drain more easily.  Use a humidifier.  Inhale steam 3 to 4 times a day (for example, sit in the bathroom with the shower running).  Apply a warm, moist washcloth to your face 3 to 4 times a day, or as directed by your caregiver.  Use saline nasal sprays to help moisten and clean your sinuses.  Take over-the-counter or prescription medicines for pain, discomfort, or fever only as directed by  your caregiver. SEEK IMMEDIATE MEDICAL CARE IF:  You have increasing pain or severe headaches.  You have nausea, vomiting, or drowsiness.  You have swelling around your face.  You have vision problems.  You have a stiff neck.  You have difficulty breathing. MAKE SURE YOU:   Understand these  instructions.  Will watch your condition.  Will get help right away if you are not doing well or get worse. Document Released: 01/31/2005 Document Revised: 04/25/2011 Document Reviewed: 02/15/2011 [facility] Patient Information 2014 [place]

## 2013-04-08 NOTE — Progress Notes (Signed)
[facility] [address], [place] [address] [phone]  Date:  04/08/2013   Name:  Suzanne Howard   DOB:  Aug 08, 1924   MRN:  [id number]  PCP:  Emeterio Reeve, MD    Chief Complaint: Sore Throat   History of Present Illness:  Suzanne Howard is a 80 y.o. very pleasant female patient who presents with the following:  Ill all week with a stuffy nose and nasal drainage.  Has purulent post nasal drainage has a cough productive of purulent sputum.  No wheezing or shortness of breath.  No nausea or vomiting.  No fever or chills.  Unable to leave house due to ice.  Has a sore throat.  No improvement with over the counter medications or other home remedies. Denies other complaint or health concern today.   There are no active problems to display for this patient.   Past Medical History  Diagnosis Date  . Hypertension   . Heart murmur     Evaluated by [facility] cardiology last year pt reports no follow up required  . Thoracic stenosis   . Kidney stones   . Stroke 1998    no residual effects  . Thoracic spondylosis with myelopathy   . History of pneumonia     as a child    Past Surgical History  Procedure Laterality Date  . Lithotripsy  2012  . Thoracic fusion    . Appendectomy      as a teenager  . Endometrial ablation    . Cataract extraction, bilateral    . Tonsillectomy and adenoidectomy      History  Substance Use Topics  . Smoking status: Never Smoker   . Smokeless tobacco: Never Used  . Alcohol Use: No    Family History  Problem Relation Age of Onset  . Anesthesia problems Neg Hx     Allergies  Allergen Reactions  . Betadine [Povidone Iodine] Itching    Pt can tolerate shrimp and shellfish    Medication list has been reviewed and updated.  Current Outpatient Prescriptions on File Prior to Visit  Medication Sig Dispense Refill  . clopidogrel (PLAVIX) 75 MG tablet Take 75 mg by mouth daily.      . Coenzyme Q10 (CO Q 10  PO) Take 2 tablets by mouth daily.      . cyanocobalamin 100 MCG tablet Take 200 mcg by mouth daily.      Marland Kitchen gabapentin (NEURONTIN) 100 MG capsule Take 1 capsule (100 mg total) by mouth 3 (three) times daily.  90 capsule  3  . HYDROcodone-acetaminophen (NORCO/VICODIN) 5-325 MG per tablet Take 1 tablet by mouth every 6 (six) hours as needed for pain.      . metoprolol succinate (TOPROL-XL) 50 MG 24 hr tablet Take 50 mg by mouth daily. Take with or immediately following a meal.      . traMADol (ULTRAM) 50 MG tablet Take 1 tablet (50 mg total) by mouth every 6 (six) hours as needed for pain.  50 tablet  0   No current facility-administered medications on file prior to visit.    Review of Systems:  As per HPI, otherwise negative.    Physical Examination: Filed Vitals:   04/08/13 1000  BP: 126/68  Pulse: 97  Temp: 98.3 F (36.8 C)  Resp: 18   Filed Vitals:   04/08/13 1000  Height: 5' 3.5" (1.613 m)  Weight: 199 lb 12.8 oz (90.629 kg)   Body mass  index is 34.83 kg/(m^2). Ideal Body Weight: Weight in (lb) to have BMI = 25: 143.1  GEN: WDWN, NAD, Non-toxic, A & O x 3 HEENT: Atraumatic, Normocephalic. Neck supple. No masses, No LAD. Ears and Nose: No external deformity. CV: RRR, No M/G/R. No JVD. No thrill. No extra heart sounds. PULM: CTA B, no wheezes, crackles, rhonchi. No retractions. No resp. distress. No accessory muscle use. ABD: S, NT, ND, +BS. No rebound. No HSM. EXTR: No c/c/e NEURO Normal gait.  PSYCH: Normally interactive. Conversant. Not depressed or anxious appearing.  Calm demeanor.    Assessment and Plan: Sinusitis Bronchitis augmentin Robitussin DM   Signed,  Phillips OdorJeffery Kaoru Rezendes, MD

## 2013-04-09 ENCOUNTER — Other Ambulatory Visit: Payer: Medicare Other

## 2013-04-15 ENCOUNTER — Ambulatory Visit
Admission: RE | Admit: 2013-04-15 | Discharge: 2013-04-15 | Disposition: A | Discharge status: Home / Self Care | Payer: Medicare Other | Source: Ambulatory Visit | Attending: Physical Medicine and Rehabilitation | Admitting: Physical Medicine and Rehabilitation

## 2013-04-15 DIAGNOSIS — M549 Dorsalgia, unspecified: Secondary | ICD-10-CM

## 2013-04-15 MED ORDER — GADOBENATE DIMEGLUMINE 529 MG/ML IV SOLN
9.0000 mL | Freq: Once | INTRAVENOUS | Status: AC | PRN
  Administered 2013-04-15: 9 mL via INTRAVENOUS

## 2013-12-31 ENCOUNTER — Ambulatory Visit (HOSPITAL_COMMUNITY): Payer: Medicare Other | Attending: Cardiovascular Disease | Admitting: Radiology

## 2013-12-31 ENCOUNTER — Other Ambulatory Visit (HOSPITAL_COMMUNITY): Payer: Self-pay | Admitting: Family Medicine

## 2013-12-31 DIAGNOSIS — E785 Hyperlipidemia, unspecified: Secondary | ICD-10-CM | POA: Insufficient documentation

## 2013-12-31 DIAGNOSIS — I77810 Thoracic aortic ectasia: Secondary | ICD-10-CM

## 2013-12-31 DIAGNOSIS — I10 Essential (primary) hypertension: Secondary | ICD-10-CM | POA: Diagnosis not present

## 2013-12-31 NOTE — Progress Notes (Signed)
Echocardiogram performed.  

## 2014-03-12 NOTE — Progress Notes (Signed)
     HPI: 81 year old female for evaluation of aortic stenosis. Echocardiogram November 2015 showed normal LV function, grade 1 diastolic dysfunction, mild to moderate aortic stenosis with a mean gradient of 21 mmHg, mildly dilated ascending aorta at 43 mm. Patient denies dyspnea, chest pain, palpitations or syncope.  Current Outpatient Prescriptions  Medication Sig Dispense Refill  . clopidogrel (PLAVIX) 75 MG tablet Take 75 mg by mouth daily.    . cyanocobalamin 100 MCG tablet Take 200 mcg by mouth daily.    Marland Kitchen. gabapentin (NEURONTIN) 100 MG capsule Take 1 capsule (100 mg total) by mouth 3 (three) times daily. (Patient taking differently: Take 100 mg by mouth 2 (two) times daily. ) 90 capsule 3  . lisinopril-hydrochlorothiazide (PRINZIDE,ZESTORETIC) 20-12.5 MG per tablet Take 1 tablet by mouth daily.    . metoprolol succinate (TOPROL-XL) 50 MG 24 hr tablet Take 50 mg by mouth daily. Take with or immediately following a meal.    . traMADol (ULTRAM) 50 MG tablet Take 1 tablet (50 mg total) by mouth every 6 (six) hours as needed for pain. 50 tablet 0   No current facility-administered medications for this visit.    Allergies  Allergen Reactions  . Betadine [Povidone Iodine] Itching    Pt can tolerate shrimp and shellfish     Past Medical History  Diagnosis Date  . Hypertension   . Aortic stenosis   . Thoracic stenosis   . Nephrolithiasis   . Stroke 1998    no residual effects  . Thoracic spondylosis with myelopathy   . History of pneumonia     as a child  . Hyperlipidemia     Past Surgical History  Procedure Laterality Date  . Lithotripsy  2012  . Thoracic fusion    . Appendectomy      as a teenager  . Endometrial ablation    . Cataract extraction, bilateral    . Tonsillectomy and adenoidectomy      History   Social History  . Marital Status: Married    Spouse Name: N/A    Number of Children: 3  . Years of Education: N/A   Occupational History  . Not on file.    Social History Main Topics  . Smoking status: Never Smoker   . Smokeless tobacco: Never Used  . Alcohol Use: No  . Drug Use: No  . Sexual Activity: No   Other Topics Concern  . Not on file   Social History Narrative    Family History  Problem Relation Age of Onset  . Anesthesia problems Neg Hx   . Heart disease Father     ROS: no fevers or chills, productive cough, hemoptysis, dysphasia, odynophagia, melena, hematochezia, dysuria, hematuria, rash, seizure activity, orthopnea, PND, pedal edema, claudication. Remaining systems are negative.  Physical Exam:   Blood pressure 130/60, pulse 76, height 5\' 5"  (1.651 m), weight 181 lb 1.6 oz (82.146 kg).  General:  Well developed/obese in NAD Skin warm/dry Patient not depressed No peripheral clubbing Back-normal HEENT-normal/normal eyelids Neck supple/normal carotid upstroke bilaterally; no bruits; no JVD; no thyromegaly chest - CTA/ normal expansion CV - RRR/normal S1 and S2; no rubs or gallops;  PMI nondisplaced, 3/6 systolic murmur left sternal border. S2 mildly diminished. Abdomen -NT/ND, no HSM, no mass, + bowel sounds, no bruit 2+ femoral pulses, no bruits Ext-no edema, chords, 2+ DP Neuro-grossly nonfocal  ECG normal sinus rhythm at a rate of 76. No ST changes.

## 2014-03-17 ENCOUNTER — Encounter: Payer: Self-pay | Admitting: Cardiology

## 2014-03-17 ENCOUNTER — Ambulatory Visit (INDEPENDENT_AMBULATORY_CARE_PROVIDER_SITE_OTHER): Payer: Medicare Other | Admitting: Cardiology

## 2014-03-17 VITALS — BP 130/60 | HR 76 | Ht 65.0 in | Wt 181.1 lb

## 2014-03-17 DIAGNOSIS — I350 Nonrheumatic aortic (valve) stenosis: Secondary | ICD-10-CM | POA: Insufficient documentation

## 2014-03-17 DIAGNOSIS — E785 Hyperlipidemia, unspecified: Secondary | ICD-10-CM | POA: Insufficient documentation

## 2014-03-17 DIAGNOSIS — I10 Essential (primary) hypertension: Secondary | ICD-10-CM

## 2014-03-17 NOTE — Assessment & Plan Note (Signed)
Blood pressure controlled. Continue present medications. 

## 2014-03-17 NOTE — Assessment & Plan Note (Signed)
Management per primary care. 

## 2014-03-17 NOTE — Assessment & Plan Note (Signed)
Patient has moderate aortic stenosis by mean gradient. However she is not having symptoms. I discussed at length the symptoms to be watchful of. Plan to repeat echocardiogram in November 2016. This will also assess ascending aorta. Given her age I would like to be conservative if possible.

## 2014-03-17 NOTE — Patient Instructions (Signed)
Your physician wants you to follow-up in: 9 MONTHS WITH DR CRENSHAW You will receive a reminder letter in the mail two months in advance. If you don't receive a letter, please call our office to schedule the follow-up appointment.  

## 2014-12-12 NOTE — Progress Notes (Signed)
      HPI: FU aortic stenosis. Echocardiogram November 2015 showed normal LV function, grade 1 diastolic dysfunction, mild to moderate aortic stenosis with a mean gradient of 21 mmHg, mildly dilated ascending aorta at 43 mm. Since last seen, the patient denies any dyspnea on exertion, orthopnea, PND, pedal edema, palpitations, syncope or chest pain.   Current Outpatient Prescriptions  Medication Sig Dispense Refill  . clopidogrel (PLAVIX) 75 MG tablet Take 75 mg by mouth daily.    . cyanocobalamin 100 MCG tablet Take 200 mcg by mouth daily.    Marland Kitchen. gabapentin (NEURONTIN) 100 MG capsule Take 1 capsule (100 mg total) by mouth 3 (three) times daily. (Patient taking differently: Take 100 mg by mouth 2 (two) times daily. ) 90 capsule 3  . lisinopril-hydrochlorothiazide (PRINZIDE,ZESTORETIC) 20-12.5 MG per tablet Take 1 tablet by mouth daily.    . metoprolol succinate (TOPROL-XL) 50 MG 24 hr tablet Take 50 mg by mouth daily. Take with or immediately following a meal.    . traMADol (ULTRAM) 50 MG tablet Take 1 tablet (50 mg total) by mouth every 6 (six) hours as needed for pain. 50 tablet 0   No current facility-administered medications for this visit.     Past Medical History  Diagnosis Date  . Hypertension   . Aortic stenosis   . Thoracic stenosis   . Nephrolithiasis   . Stroke (HCC) 1998    no residual effects  . Thoracic spondylosis with myelopathy   . History of pneumonia     as a child  . Hyperlipidemia     Past Surgical History  Procedure Laterality Date  . Lithotripsy  2012  . Thoracic fusion    . Appendectomy      as a teenager  . Endometrial ablation    . Cataract extraction, bilateral    . Tonsillectomy and adenoidectomy      Social History   Social History  . Marital Status: Married    Spouse Name: N/A  . Number of Children: 3  . Years of Education: N/A   Occupational History  . Not on file.   Social History Main Topics  . Smoking status: Never Smoker   .  Smokeless tobacco: Never Used  . Alcohol Use: No  . Drug Use: No  . Sexual Activity: No   Other Topics Concern  . Not on file   Social History Narrative    ROS: no fevers or chills, productive cough, hemoptysis, dysphasia, odynophagia, melena, hematochezia, dysuria, hematuria, rash, seizure activity, orthopnea, PND, pedal edema, claudication. Remaining systems are negative.  Physical Exam: Well-developed well-nourished in no acute distress.  Skin is warm and dry.  HEENT is normal.  Neck is supple.  Chest is clear to auscultation with normal expansion.  Cardiovascular exam is regular rate and rhythm. 3/6 systolic murmur left sternal border. S2 is diminished. Abdominal exam nontender or distended. No masses palpated. Extremities show no edema. neuro grossly intact  ECG Sinus rhythm at a rate of 79. No ST changes.

## 2014-12-15 ENCOUNTER — Encounter: Payer: Self-pay | Admitting: Cardiology

## 2014-12-15 ENCOUNTER — Ambulatory Visit (INDEPENDENT_AMBULATORY_CARE_PROVIDER_SITE_OTHER): Payer: Medicare Other | Admitting: Cardiology

## 2014-12-15 VITALS — BP 150/76 | HR 79 | Ht 64.0 in | Wt 191.2 lb

## 2014-12-15 DIAGNOSIS — I350 Nonrheumatic aortic (valve) stenosis: Secondary | ICD-10-CM | POA: Diagnosis not present

## 2014-12-15 DIAGNOSIS — E785 Hyperlipidemia, unspecified: Secondary | ICD-10-CM | POA: Diagnosis not present

## 2014-12-15 MED ORDER — CLOPIDOGREL BISULFATE 75 MG PO TABS: 75.0000 mg | ORAL_TABLET | Freq: Every day | ORAL | Status: AC

## 2014-12-15 MED ORDER — METOPROLOL SUCCINATE ER 50 MG PO TB24: 50.0000 mg | ORAL_TABLET | Freq: Every day | ORAL | Status: DC

## 2014-12-15 MED ORDER — LISINOPRIL-HYDROCHLOROTHIAZIDE 20-12.5 MG PO TABS: 1.0000 | ORAL_TABLET | Freq: Every day | ORAL | Status: DC

## 2014-12-15 NOTE — Patient Instructions (Signed)

## 2014-12-15 NOTE — Assessment & Plan Note (Signed)
Patient has moderate aortic stenosis by mean gradient. However she is not having symptoms. I discussed at length the symptoms to be watchful of. Plan to repeat echocardiogram. Given her age I would like to be conservative if possible.

## 2014-12-15 NOTE — Assessment & Plan Note (Signed)
Management per primary care. 

## 2014-12-15 NOTE — Assessment & Plan Note (Signed)
Continue present blood pressure medications. 

## 2014-12-24 ENCOUNTER — Other Ambulatory Visit: Payer: Self-pay

## 2014-12-24 ENCOUNTER — Ambulatory Visit (HOSPITAL_COMMUNITY): Payer: Medicare Other | Attending: Cardiology

## 2014-12-24 DIAGNOSIS — I10 Essential (primary) hypertension: Secondary | ICD-10-CM | POA: Diagnosis not present

## 2014-12-24 DIAGNOSIS — E785 Hyperlipidemia, unspecified: Secondary | ICD-10-CM | POA: Insufficient documentation

## 2014-12-24 DIAGNOSIS — I77810 Thoracic aortic ectasia: Secondary | ICD-10-CM | POA: Insufficient documentation

## 2014-12-24 DIAGNOSIS — I350 Nonrheumatic aortic (valve) stenosis: Secondary | ICD-10-CM | POA: Insufficient documentation

## 2014-12-24 DIAGNOSIS — I517 Cardiomegaly: Secondary | ICD-10-CM | POA: Diagnosis not present

## 2014-12-24 DIAGNOSIS — I352 Nonrheumatic aortic (valve) stenosis with insufficiency: Secondary | ICD-10-CM | POA: Insufficient documentation

## 2014-12-24 DIAGNOSIS — I340 Nonrheumatic mitral (valve) insufficiency: Secondary | ICD-10-CM | POA: Insufficient documentation

## 2014-12-26 ENCOUNTER — Telehealth: Payer: Self-pay | Admitting: Cardiology

## 2014-12-26 NOTE — Telephone Encounter (Signed)
New Message   Pt wants to speak to the rn about her results of her ECHO

## 2014-12-26 NOTE — Telephone Encounter (Signed)
Returned call to patient echo results given. 

## 2015-02-03 ENCOUNTER — Ambulatory Visit (INDEPENDENT_AMBULATORY_CARE_PROVIDER_SITE_OTHER): Payer: Medicare Other | Admitting: Family Medicine

## 2015-02-03 VITALS — BP 140/72 | HR 87 | Temp 98.5°F | Resp 16 | Ht 65.0 in | Wt 193.0 lb

## 2015-02-03 DIAGNOSIS — R0981 Nasal congestion: Secondary | ICD-10-CM

## 2015-02-03 DIAGNOSIS — J029 Acute pharyngitis, unspecified: Secondary | ICD-10-CM | POA: Diagnosis not present

## 2015-02-03 LAB — POCT CBC
Granulocyte percent: 77.4 %G (ref 37–80)
HEMATOCRIT: 37.8 % (ref 37.7–47.9)
HEMOGLOBIN: 12.9 g/dL (ref 12.2–16.2)
LYMPH, POC: 1.3 (ref 0.6–3.4)
MCH: 30.3 pg (ref 27–31.2)
MCHC: 34.2 g/dL (ref 31.8–35.4)
MCV: 88.6 fL (ref 80–97)
MID (CBC): 0.8 (ref 0–0.9)
MPV: 7.5 fL (ref 0–99.8)
PLATELET COUNT, POC: 165 10*3/uL (ref 142–424)
POC Granulocyte: 7.2 — AB (ref 2–6.9)
POC LYMPH PERCENT: 13.7 %L (ref 10–50)
POC MID %: 8.9 % (ref 0–12)
RBC: 4.26 M/uL (ref 4.04–5.48)
RDW, POC: 13.1 %
WBC: 9.3 10*3/uL (ref 4.6–10.2)

## 2015-02-03 NOTE — Progress Notes (Signed)
[facility] [address], [place] [id number] [phone]  Date:  02/03/2015   Name:  Suzanne CorriganRebecca S Howard   DOB:  07/12/1924   MRN:  [id number]  PCP:  Emeterio ReeveWOLTERS,SHARON A, MD    Chief Complaint: Sinus Problem and Sore Throat   History of Present Illness:  Suzanne Howard is a 81 y.o. very pleasant female patient who presents with the following:  history of HTN, aortic steonsis.  Here today with complaint of ST and hoarse voice that started yesterday am.   She brought her husband who is ill in today so she decided to be seen as well. She does not have a cough really  No fever, chills or aches.   She feels congested in her sinuses.  She still has a ST but this is better.   No GI symtpoms.    She tried an OTC allergy medication for this so far.    She [place] and they are really enjoying this.    Patient Active Problem List   Diagnosis Date Noted  . Aortic stenosis 03/17/2014  . Essential hypertension 03/17/2014  . Hyperlipidemia 03/17/2014    Past Medical History  Diagnosis Date  . Hypertension   . Aortic stenosis   . Thoracic stenosis   . Nephrolithiasis   . Stroke (HCC) 1998    no residual effects  . Thoracic spondylosis with myelopathy   . History of pneumonia     as a child  . Hyperlipidemia     Past Surgical History  Procedure Laterality Date  . Lithotripsy  2012  . Thoracic fusion    . Appendectomy      as a teenager  . Endometrial ablation    . Cataract extraction, bilateral    . Tonsillectomy and adenoidectomy      Social History  Substance Use Topics  . Smoking status: Never Smoker   . Smokeless tobacco: Never Used  . Alcohol Use: No    Family History  Problem Relation Age of Onset  . Anesthesia problems Neg Hx   . Heart disease Father     Allergies  Allergen Reactions  . Betadine [Povidone Iodine] Itching    Pt can tolerate shrimp and shellfish     Medication list has been reviewed and updated.  Current Outpatient Prescriptions on File Prior to Visit  Medication Sig Dispense Refill  . clopidogrel (PLAVIX) 75 MG tablet Take 1 tablet (75 mg total) by mouth daily. 90 tablet 3  . gabapentin (NEURONTIN) 100 MG capsule Take 1 capsule (100 mg total) by mouth 3 (three) times daily. (Patient taking differently: Take 100 mg by mouth 2 (two) times daily. ) 90 capsule 3  . lisinopril-hydrochlorothiazide (PRINZIDE,ZESTORETIC) 20-12.5 MG tablet Take 1 tablet by mouth daily. 90 tablet 3  . metoprolol succinate (TOPROL-XL) 50 MG 24 hr tablet Take 1 tablet (50 mg total) by mouth daily. Take with or immediately following a meal. 90 tablet 3   No current facility-administered medications on file prior to visit.    Review of Systems:  As per HPI- otherwise negative.   Physical Examination: Filed Vitals:   02/03/15 1022  BP: 140/72  Pulse: 87  Temp: 98.5 F (36.9 C)  Resp: 16   Filed Vitals:   02/03/15 1022  Height: 5\' 5"  (1.651 m)  Weight: 193 lb (87.544 kg)   Body mass index is 32.12 kg/(m^2). Ideal Body Weight: Weight in (  lb) to have BMI = 25: 149.9  GEN: WDWN, NAD, Non-toxic, A & O x 3, overweight, looks well HEENT: Atraumatic, Normocephalic. Neck supple. No masses, No LAD.  Bilateral TM wnl, oropharynx normal.  PEERL,EOMI.   Ears and Nose: No external deformity. CV: RRR, mild murmur of AS, noG/R. No JVD. No thrill. No extra heart sounds. PULM: CTA B, no wheezes, crackles, rhonchi. No retractions. No resp. distress. No accessory muscle use. EXTR: No c/c/e NEURO Normal gait for pt, uses a walker PSYCH: Normally interactive. Conversant. Not depressed or anxious appearing.  Calm demeanor.   Results for orders placed or performed in visit on 02/03/15  POCT CBC  Result Value Ref Range   WBC 9.3 4.6 - 10.2 K/uL   Lymph, poc 1.3 0.6 - 3.4   POC LYMPH PERCENT 13.7 10 - 50 %L   MID (cbc) 0.8 0 - 0.9   POC MID % 8.9 0 - 12 %M    POC Granulocyte 7.2 (A) 2 - 6.9   Granulocyte percent 77.4 37 - 80 %G   RBC 4.26 4.04 - 5.48 M/uL   Hemoglobin 12.9 12.2 - 16.2 g/dL   HCT, POC 81.1 91.4 - 47.9 %   MCV 88.6 80 - 97 fL   MCH, POC 30.3 27 - 31.2 pg   MCHC 34.2 31.8 - 35.4 g/dL   RDW, POC 78.2 %   Platelet Count, POC 165 142 - 424 K/uL   MPV 7.5 0 - 99.8 fL    Assessment and Plan: Sinus congestion - Plan: POCT CBC  Sore throat - Plan: POCT CBC  Likely viral URI.  She will monitor her sx and let me know if not better soon- Sooner if worse.     Signed Abbe Amsterdam, MD

## 2015-02-03 NOTE — Patient Instructions (Signed)
It appears that you most likely have a viral infection- a cold.  However please let me know if you are not feeling better in the next few days.

## 2015-05-21 ENCOUNTER — Telehealth: Payer: Self-pay | Admitting: Cardiology

## 2015-05-21 NOTE — Telephone Encounter (Signed)
Pt having dizziness since about a week ago-not any worse but not getting better-pt not on any new meds-pls call [phone]

## 2015-05-21 NOTE — Telephone Encounter (Signed)
Pt called c/o of dizziness first thing in the morning when getting out of bed. She says it lasts less than a minute, she sits on the side of the bed for a few minutes then gets up and as stated by pt "goes about her business" with no problems. She may experience the same feeling throughout the day if she changes positions i.e. Bending over or standing suddenly and then she is fine after a few moments. Pt stated she had just returned from the grocery store and she had no problem completing that task. A [facility] nurse was at her home this morning and took her BP and it was 126/78, pulse 74. No complaints of palpitations, SOB, chest pain, or swelling.Pt misplaced her BP cuff so I encouraged her to purchase another one if she can.  Told pt to continue to take it slow when she changes positions and to call if she has any other worsening symptoms. Pt has a 6 month recall to be scheduled for f/u coming up with Dr Jens Somrenshaw, pt aware.

## 2015-07-04 ENCOUNTER — Ambulatory Visit (INDEPENDENT_AMBULATORY_CARE_PROVIDER_SITE_OTHER): Payer: Medicare Other | Admitting: Physician Assistant

## 2015-07-04 VITALS — BP 142/80 | HR 90 | Temp 98.9°F | Resp 18 | Ht 65.0 in | Wt 188.6 lb

## 2015-07-04 DIAGNOSIS — J069 Acute upper respiratory infection, unspecified: Secondary | ICD-10-CM | POA: Diagnosis not present

## 2015-07-04 DIAGNOSIS — B9789 Other viral agents as the cause of diseases classified elsewhere: Principal | ICD-10-CM

## 2015-07-04 MED ORDER — DM-GUAIFENESIN ER 30-600 MG PO TB12: 1.0000 | ORAL_TABLET | Freq: Two times a day (BID) | ORAL | Status: AC

## 2015-07-04 MED ORDER — LORATADINE 10 MG PO TABS: 10.0000 mg | ORAL_TABLET | Freq: Every day | ORAL | Status: DC

## 2015-07-04 MED ORDER — IPRATROPIUM BROMIDE 0.06 % NA SOLN: 2.0000 | Freq: Four times a day (QID) | NASAL | Status: AC

## 2015-07-04 NOTE — Patient Instructions (Signed)
     IF you received an x-ray today, you will receive an invoice from [facility]. Please contact [facility] at [phone] with questions or concerns regarding your invoice.   IF you received labwork today, you will receive an invoice from [facility]. Please contact [facility] at [phone] with questions or concerns regarding your invoice.   Our billing staff will not be able to assist you with questions regarding bills from these companies.  You will be contacted with the lab results as soon as they are available. The fastest way to get your results is to activate your My Chart account. Instructions are located on the last page of this paperwork. If you have not heard from us regarding the results in 2 weeks, please contact this office.      

## 2015-07-04 NOTE — Progress Notes (Signed)
   07/04/2015 11:05 AM   DOB: 1924-12-02 / MRN: [id number]  SUBJECTIVE:  Suzanne Howard is a 82 y.o. female presenting for for the evaluation of runny nose, congestion and cough that started 2 days ago.  She denies fever, difficulty breathing and headache. Treatments tried thus far include several OTC preps with fair to poor relief. She reports sick contacts.  She is allergic to betadine.   She  has a past medical history of Hypertension; Aortic stenosis; Thoracic stenosis; Nephrolithiasis; Stroke (HCC) (1998); Thoracic spondylosis with myelopathy; History of pneumonia; and Hyperlipidemia.    She  reports that she has never smoked. She has never used smokeless tobacco. She reports that she does not drink alcohol or use illicit drugs. She  reports that she does not engage in sexual activity. The patient  has past surgical history that includes Lithotripsy (2012); Thoracic Fusion; Appendectomy; Endometrial ablation; Cataract extraction, bilateral; and Tonsillectomy and adenoidectomy.  Her family history includes Heart disease in her father. There is no history of Anesthesia problems.  Review of Systems  Constitutional: Negative for fever and chills.  Skin: Negative for rash.  Neurological: Negative for dizziness and headaches.    Problem list and medications reviewed and updated by myself where necessary, and exist elsewhere in the encounter.   OBJECTIVE:  BP 142/80 mmHg  Pulse 90  Temp(Src) 98.9 F (37.2 C) (Oral)  Resp 18  Ht 5\' 5"  (1.651 m)  Wt 188 lb 9.6 oz (85.548 kg)  BMI 31.38 kg/m2  SpO2 95%  Physical Exam  Constitutional: She is oriented to person, place, and time. She appears well-nourished. No distress.  HENT:  Right Ear: Hearing and tympanic membrane normal.  Left Ear: Hearing and tympanic membrane normal.  Nose: Mucosal edema present. No sinus tenderness. Right sinus exhibits no maxillary sinus tenderness and no frontal sinus tenderness. Left sinus exhibits no  maxillary sinus tenderness and no frontal sinus tenderness.  Mouth/Throat: Uvula is midline, oropharynx is clear and moist and mucous membranes are normal.  Eyes: EOM are normal. Pupils are equal, round, and reactive to light.  Cardiovascular: Normal rate, normal rhythm.   Pulmonary/Chest: Effort normal. Breath sounds normal. Negative for wheezes and rales.  Abdominal: She exhibits no distension.  Neurological: She is alert and oriented to person, place, and time. No cranial nerve deficit. Skin: Skin is dry. She is not diaphoretic.  Vitals reviewed.  No results found for this or any previous visit (from the past 48 hour(s)).  ASSESSMENT AND PLAN:  Suzanne Howard was seen today for sore throat and cough.  Diagnoses and all orders for this visit:  Viral URI with cough -     loratadine (CLARITIN) 10 MG tablet; Take 1 tablet (10 mg total) by mouth daily. -     dextromethorphan-guaiFENesin (MUCINEX DM) 30-600 MG 12hr tablet; Take 1 tablet by mouth 2 (two) times daily. -     ipratropium (ATROVENT) 0.06 % nasal spray; Place 2 sprays into both nostrils 4 (four) times daily.    The patient was advised to call or return to clinic if she does not see an improvement in symptoms or to seek the care of the closest emergency department if she worsens with the above plan.   Deliah BostonMichael Clark, MHS, PA-C [facility] 07/04/2015 11:05 AM

## 2015-09-12 ENCOUNTER — Ambulatory Visit (INDEPENDENT_AMBULATORY_CARE_PROVIDER_SITE_OTHER): Payer: Medicare Other | Admitting: Family Medicine

## 2015-09-12 VITALS — BP 188/90 | HR 83 | Temp 98.7°F | Resp 16 | Ht 63.0 in | Wt 187.5 lb

## 2015-09-12 DIAGNOSIS — S70361A Insect bite (nonvenomous), right thigh, initial encounter: Secondary | ICD-10-CM

## 2015-09-12 DIAGNOSIS — W57XXXA Bitten or stung by nonvenomous insect and other nonvenomous arthropods, initial encounter: Secondary | ICD-10-CM

## 2015-09-12 NOTE — Progress Notes (Signed)
Subjective:    Patient ID: Suzanne Howard, female    DOB: 1924/04/24, 82 y.o.   MRN: [id number] By signing my name below, I, Javier Docker, attest that this documentation has been prepared under the direction and in the presence of Norberto Sorenson, MD. Electronically Signed: Javier Docker, ER Scribe. 09/12/2015. 9:37 AM.  Chief Complaint  Patient presents with  . Insect Bite    6 days ago   HPI HPI Comments: Suzanne Howard is a 82 y.o. female who presents to [facility] complaining of a bug bite one week ago that has continued to be sore since the time of the bite. She thinks the bite occurred while she was sleeping. She has been applying gold bond lotion to the bite. She initially applied an ice pack. The bite had buss two days ago, but that has since cleared up. [name] at [facility] is her PCP.    Past Medical History:  Diagnosis Date  . Aortic stenosis   . History of pneumonia    as a child  . Hyperlipidemia   . Hypertension   . Nephrolithiasis   . Stroke (HCC) 1998   no residual effects  . Thoracic spondylosis with myelopathy   . Thoracic stenosis    Allergies  Allergen Reactions  . Betadine [Povidone Iodine] Itching    Pt can tolerate shrimp and shellfish   Current Outpatient Prescriptions on File Prior to Visit  Medication Sig Dispense Refill  . clopidogrel (PLAVIX) 75 MG tablet Take 1 tablet (75 mg total) by mouth daily. 90 tablet 3  . dextromethorphan-guaiFENesin (MUCINEX DM) 30-600 MG 12hr tablet Take 1 tablet by mouth 2 (two) times daily. 14 tablet 0  . ipratropium (ATROVENT) 0.06 % nasal spray Place 2 sprays into both nostrils 4 (four) times daily. 15 mL 12  . lisinopril-hydrochlorothiazide (PRINZIDE,ZESTORETIC) 20-12.5 MG tablet Take 1 tablet by mouth daily. 90 tablet 3  . metoprolol succinate (TOPROL-XL) 50 MG 24 hr tablet Take 1 tablet (50 mg total) by mouth daily. Take with or immediately following a meal. 90 tablet 3   No current facility-administered  medications on file prior to visit.     Review of Systems  Constitutional: Negative for activity change, appetite change, chills and fever.  Musculoskeletal: Positive for arthralgias. Negative for gait problem and joint swelling.  Skin: Positive for color change. Negative for wound.  Allergic/Immunologic: Negative for immunocompromised state.  Neurological: Negative for weakness and numbness.  Psychiatric/Behavioral: Negative for sleep disturbance.      Objective:  Physical Exam  Constitutional: She is oriented to person, place, and time. She appears well-developed and well-nourished. No distress.  HENT:  Head: Normocephalic and atraumatic.  Eyes: Pupils are equal, round, and reactive to light.  Neck: Neck supple.  Cardiovascular: Normal rate.   Pulmonary/Chest: Effort normal. No respiratory distress.  Musculoskeletal: Normal range of motion.  Neurological: She is alert and oriented to person, place, and time. Coordination normal.  Skin: Skin is warm and dry. She is not diaphoretic.  Very well demarcated blanching erythematous macule larger than a palm on the posterior aspect of the thigh. No fluctuance or induration, positive warmth. No nodules or masses.   Psychiatric: She has a normal mood and affect. Her behavior is normal.  Nursing note and vitals reviewed.  BP (!) 188/90 (BP Location: Right Arm, Patient Position: Sitting, Cuff Size: Normal)   Pulse 83   Temp 98.7 F (37.1 C) (Oral)   Resp 16   Ht  5\' 3"  (1.6 m)   Wt 187 lb 8 oz (85 kg)   SpO2 98%   BMI 33.21 kg/m   BP recheck by Dr. Clelia Croft 160/85.    Assessment & Plan:   1. Bug bite   Patient reassured that seems to be improving. No abscess, no site of bite visualized. Do not think we need to treat for cellulitis as no induration or tenderness and erythema is mild. Outlined with skin script marker - if extends out, will call in course of bid kelfex.  Try warm compresses and watchful waiting.   I personally performed  the services described in this documentation, which was scribed in my presence. The recorded information has been reviewed and considered, and addended by me as needed.   Norberto Sorenson, M.D.  [facility] [address] [place], [place] [address] [phone] phone [phone] fax  09/12/15 1:40 PM

## 2015-09-12 NOTE — Patient Instructions (Addendum)
If the red rash extend outside the boundaries of the [place], please call us so I can call you in an antbiotic. Do warm compresses several times a day.   IF you received an x-ray today, you will receive an invoice from [facility]. Please contact [facility] at [phone] with questions or concerns regarding your invoice.   IF you received labwork today, you will receive an invoice from [employer]. Please contact [facility] at [phone] with questions or concerns regarding your invoice.   Our billing staff will not be able to assist you with questions regarding bills from these companies.  You will be contacted with the lab results as soon as they are available. The fastest way to get your results is to activate your My Chart account. Instructions are located on the last page of this paperwork. If you have not heard from [place] regarding the results in 2 weeks, please contact this office.     b Insect Bite Mosquitoes, flies, fleas, bedbugs, and many other insects can bite. Insect bites are different from insect stings. A sting is when poison (venom) is injected into the skin. Insect bites can cause pain or itching for a few days, but they are usually not serious. Some insects can spread diseases to people through a bite. SYMPTOMS  Symptoms of an insect bite include:  Itching or pain in the bite area.  Redness and swelling in the bite area.  An open wound (skin ulcer). In many cases, symptoms last for 2-4 days.  DIAGNOSIS  This condition is usually diagnosed based on symptoms and a physical exam. TREATMENT  Treatment is usually not needed for an insect bite. Symptoms often go away on their own. Your health care provider may recommend creams or lotions to help reduce itching. Antibiotic medicines may be prescribed if the bite becomes infected. A tetanus shot may be given in some cases. If you develop an allergic reaction to an insect bite, your  health care provider will prescribe medicines to treat the reaction (antihistamines). This is rare. HOME CARE INSTRUCTIONS  Do not scratch the bite area.  Keep the bite area clean and dry. Wash the bite area daily with soap and water as told by your health care provider.  If directed, applyice to the bite area.  Put ice in a plastic bag.  Place a towel between your skin and the bag.  Leave the ice on for 20 minutes, 2-3 times per day.  To help reduce itching and swelling, try applying a baking soda paste, cortisone cream, or calamine lotion to the bite area as told by your health care provider.  Apply or take over-the-counter and prescription medicines only as told by your health care provider.  If you were prescribed an antibiotic medicine, use it as told by your health care provider. Do not stop using the antibiotic even if your condition improves.  Keep all follow-up visits as told by your health care provider. This is important. PREVENTION   Use insect repellent. The best insect repellents contain:  DEET, picaridin, oil of lemon eucalyptus (OLE), or IR3535.  Higher amounts of an active ingredient.  When you are outdoors, wear clothing that covers your arms and legs.  Avoid opening windows that do not have window screens. SEEK MEDICAL CARE IF:  You have increased redness, swelling, or pain in the bite area.  You have a fever. SEEK IMMEDIATE MEDICAL CARE IF:   You have joint pain.   You have  fluid, blood, or pus coming from the bite area.  You have a headache or neck pain.  You have unusual weakness.  You have a rash.  You have chest pain or shortness of breath.  You have abdominal pain, nausea, or vomiting.  You feel unusually tired or sleepy.   This information is not intended to replace advice given to you by your health care provider. Make sure you discuss any questions you have with your health care provider.   Document Released: 03/10/2004 Document  Revised: 10/22/2014 Document Reviewed: 06/18/2014 Elsevier Interactive Patient Education [employer]

## 2017-09-18 ENCOUNTER — Telehealth: Payer: Self-pay | Admitting: Cardiology

## 2017-09-18 NOTE — Telephone Encounter (Signed)
Returned patient's triage call.  Patient states that she has been experiencing increased SOB over the last 6 weeks. Patient denies chest pain, syncope, palpitations. Patient reports slight LE swelling. Patient does not monitor her BP and HR regularly. [facility]  Offered patient an appt with an APP and she is agreeable.  Appt scheduled with Joni ReiningKathryn Lawrence, NP on 09/19/17 @ 11am. Patient is aware of appt. Adv patient to seek emergency care if chest pain, syncope, or increased sob develops. Patient agreeable with plan and verbalized understanding.

## 2017-09-18 NOTE — Progress Notes (Signed)
Cardiology Office Note   Date:  09/19/2017   ID:  Suzanne Howard, DOB Feb 14, 1925, MRN [id number]  PCP:  Mila Palmer, MD  Cardiologist:  Dr. Jens Som  Chief Complaint  Patient presents with  . Shortness of Breath  . Chest Pain     History of Present Illness: Suzanne Howard is a 84 y.o. female who presents for ongoing assessment and management of aortic stenosis. With grade 1 diastolic dysfunction, HTN. Hx of CVA and hyperlipidemia. Last seen by Dr. Jens Som on 12/12/2014 She called our office on 09/18/2017 for complaints of dyspnea for the last 6 weeks, with light LE edema.   She states that her breathing status is worsened along with some chest pressure. She has been having a good bit of diarrhea as well. Saw PCP and was placed on Immodium. She states that her energy is diminished.   Past Medical History:  Diagnosis Date  . Aortic stenosis   . History of pneumonia    as a child  . Hyperlipidemia   . Hypertension   . Nephrolithiasis   . Stroke (HCC) 1998   no residual effects  . Thoracic spondylosis with myelopathy   . Thoracic stenosis     Past Surgical History:  Procedure Laterality Date  . APPENDECTOMY     as a teenager  . CATARACT EXTRACTION, BILATERAL    . ENDOMETRIAL ABLATION    . LITHOTRIPSY  2012  . THORACIC FUSION    . TONSILLECTOMY AND ADENOIDECTOMY       Current Outpatient Medications  Medication Sig Dispense Refill  . ALPRAZolam (XANAX) 0.25 MG tablet Take 0.25 mg by mouth at bedtime.  2  . clopidogrel (PLAVIX) 75 MG tablet Take 1 tablet (75 mg total) by mouth daily. 90 tablet 3  . dextromethorphan-guaiFENesin (MUCINEX DM) 30-600 MG 12hr tablet Take 1 tablet by mouth 2 (two) times daily. (Patient taking differently: Take 1 tablet by mouth 2 (two) times daily as needed. ) 14 tablet 0  . ipratropium (ATROVENT) 0.06 % nasal spray Place 2 sprays into both nostrils 4 (four) times daily. 15 mL 12  . metoprolol succinate (TOPROL-XL) 50 MG 24 hr tablet  Take 1 tablet (50 mg total) by mouth daily. Take with or immediately following a meal. 90 tablet 3  . simvastatin (ZOCOR) 20 MG tablet Take 1 tablet by mouth at bedtime.    Marland Kitchen lisinopril-hydrochlorothiazide (PRINZIDE,ZESTORETIC) 20-25 MG tablet Take 1 tablet by mouth daily. 30 tablet 3   No current facility-administered medications for this visit.     Allergies:   Betadine [povidone iodine]    Social History:  The patient  reports that she has never smoked. She has never used smokeless tobacco. She reports that she does not drink alcohol or use drugs.   Family History:  The patient's family history includes Heart disease in her father.    ROS: All other systems are reviewed and negative. Unless otherwise mentioned in H&P    PHYSICAL EXAM: VS:  BP 126/72   Pulse 75   Ht 5\' 3"  (1.6 m)   Wt 176 lb 9.6 oz (80.1 kg)   BMI 31.28 kg/m  , BMI Body mass index is 31.28 kg/m. GEN: Well nourished, well developed, in no acute distress  HEENT: normal  Neck: no JVD, carotid bruits, or masses Cardiac: RRR; harsh 2/6 holosystolic murmurs, rubs, or gallops,1+ pitting  edema  Respiratory:  clear to auscultation bilaterally, normal work of breathing GI: soft, nontender, nondistended, + BS MS: no  deformity or atrophy  Skin: warm and dry, no rash Neuro:  Strength and sensation are intact Psych: euthymic mood, full affect   EKG:  NSR rate of 75 bpm. ST flattening laterally. Mild depression inferior.  Recent Labs: No results found for requested labs within last 8760 hours.    Lipid Panel No results found for: CHOL, TRIG, HDL, CHOLHDL, VLDL, LDLCALC, LDLDIRECT    Wt Readings from Last 3 Encounters:  09/19/17 176 lb 9.6 oz (80.1 kg)  09/12/15 187 lb 8 oz (85 kg)  07/04/15 188 lb 9.6 oz (85.5 kg)      Other studies Reviewed: Echocardiogram 12/24/2014 Left ventricle: The cavity size was normal. Wall thickness was   increased in a pattern of moderate LVH. Systolic function was   normal.  The estimated ejection fraction was in the range of 55%   to 60%. Wall motion was normal; there were no regional wall   motion abnormalities. Doppler parameters are consistent with   abnormal left ventricular relaxation (grade 1 diastolic   dysfunction). - Aortic valve: Trileaflet; moderately calcified leaflets. There   was mild to moderate stenosis. There was trivial regurgitation.   Mean gradient (S): 17 mm Hg. Peak gradient (S): 31 mm Hg. Valve   area (VTI): 1.38 cm^2. - Aorta: Ascending aortic diameter: 43 mm (S). - Ascending aorta: The ascending aorta was mildly dilated. - Mitral valve: Moderately calcified annulus. Mildly calcified   leaflets . There was trivial regurgitation. - Left atrium: The atrium was mildly dilated. - Right ventricle: The cavity size was normal. Systolic function   was normal. - Tricuspid valve: Peak RV-RA gradient (S): 21 mm Hg. - Pulmonary arteries: PA peak pressure: 24 mm Hg (S). - Inferior vena cava: The vessel was normal in size. The   respirophasic diameter changes were in the normal range (>= 50%),   consistent with normal central venous pressure.  ASSESSMENT AND PLAN:  1.  AoV stenosis: Moderate per echo in 2016 She is having more symptoms of chest pressure and DOE. I will repeat echocardiogram for comparison to evaluate worsening AoV stenosis. I have explained that if her valve has worsened, she may be referred to the heart valve clinic for further recommendations. She verbalizes understanding.   2. Chest pressure: Will check BMET for evaluation of hypokalemia in the setting of frequent diarrhea. She may need replacement. Continue metoprolol.   3. Hypertension: Will increase the Lisinopril?HCTZ to 20/25 mg to assist with fluid retention. Will check CBC to evaluate for anemia.   4. Hypercholesterolemia: Continue statin therapy.   Current medicines are reviewed at length with the patient today.    Labs/ tests ordered today include: BMET, CBC,  Echo Bettey MareKathryn M. Liborio NixonLawrence DNP, ANP, AACC   09/19/2017 12:31 PM    [facility] [address], [place], [place] [address] Phone: [phone]; Fax: [phone]

## 2017-09-18 NOTE — Telephone Encounter (Signed)
New message   Pt c/o Shortness Of Breath: STAT if SOB developed within the last 24 hours or pt is noticeably SOB on the phone  1. Are you currently SOB (can you hear that pt is SOB on the phone)? Yes, I can hear on the phone, however patient states that this is not new   2. How long have you been experiencing SOB? 6 weeks or more   3. Are you SOB when sitting or when up moving around? Moving around  4. Are you currently experiencing any other symptoms? No

## 2017-09-19 ENCOUNTER — Encounter: Payer: Self-pay | Admitting: Adult Health

## 2017-09-19 ENCOUNTER — Ambulatory Visit (INDEPENDENT_AMBULATORY_CARE_PROVIDER_SITE_OTHER): Payer: Medicare Other | Admitting: Adult Health

## 2017-09-19 VITALS — BP 126/72 | HR 75 | Ht 63.0 in | Wt 176.6 lb

## 2017-09-19 DIAGNOSIS — Z79899 Other long term (current) drug therapy: Secondary | ICD-10-CM

## 2017-09-19 DIAGNOSIS — I10 Essential (primary) hypertension: Secondary | ICD-10-CM | POA: Diagnosis not present

## 2017-09-19 DIAGNOSIS — I350 Nonrheumatic aortic (valve) stenosis: Secondary | ICD-10-CM

## 2017-09-19 DIAGNOSIS — E7800 Pure hypercholesterolemia, unspecified: Secondary | ICD-10-CM

## 2017-09-19 MED ORDER — LISINOPRIL-HYDROCHLOROTHIAZIDE 20-25 MG PO TABS: 1.0000 | ORAL_TABLET | Freq: Every day | ORAL | 3 refills | Status: DC

## 2017-09-19 NOTE — Patient Instructions (Signed)
Medication Instructions:  INCREASE LISINOPRIL/HCTZ 20/25MG  DAILY  If you need a refill on your cardiac medications before your next appointment, please call your pharmacy.  Labwork: CBC AND BMET TODAY HERE IN OUR OFFICE AT [facility]  Take the provided lab slips with you to the lab for your blood draw.   Testing/Procedures: Echocardiogram - Your physician has requested that you have an echocardiogram. Echocardiography is a painless test that uses sound waves to create images of your heart. It provides your doctor with information about the size and shape of your heart and how well your heart's chambers and valves are working. This procedure takes approximately one hour. There are no restrictions for this procedure. This will be performed at our [facility] location - [address], [address].  Follow-Up: Your physician wants you to follow-up in: AFTER TESTING WITH DR CRENSHAW -OR- KATHRYN LAWRENCE (NURSE PRACTIONIER), DNP,AACC IF PRIMARY CARDIOLOGIST IS UNAVAILABLE.    Thank you for choosing [facility] at [facility]!!

## 2017-09-20 ENCOUNTER — Other Ambulatory Visit: Payer: Self-pay

## 2017-09-20 ENCOUNTER — Inpatient Hospital Stay (HOSPITAL_COMMUNITY)
Admission: EM | Admit: 2017-09-20 | Discharge: 2017-09-23 | DRG: 280 | Disposition: A | Discharge status: Home / Self Care | Payer: Medicare Other | Attending: Family Medicine | Admitting: Family Medicine

## 2017-09-20 ENCOUNTER — Emergency Department (HOSPITAL_COMMUNITY): Payer: Medicare Other

## 2017-09-20 ENCOUNTER — Encounter (HOSPITAL_COMMUNITY): Payer: Self-pay

## 2017-09-20 DIAGNOSIS — N183 Chronic kidney disease, stage 3 (moderate): Secondary | ICD-10-CM | POA: Diagnosis present

## 2017-09-20 DIAGNOSIS — Z66 Do not resuscitate: Secondary | ICD-10-CM | POA: Diagnosis present

## 2017-09-20 DIAGNOSIS — E876 Hypokalemia: Secondary | ICD-10-CM | POA: Diagnosis present

## 2017-09-20 DIAGNOSIS — R0602 Shortness of breath: Secondary | ICD-10-CM | POA: Diagnosis present

## 2017-09-20 DIAGNOSIS — Z8249 Family history of ischemic heart disease and other diseases of the circulatory system: Secondary | ICD-10-CM | POA: Diagnosis not present

## 2017-09-20 DIAGNOSIS — I351 Nonrheumatic aortic (valve) insufficiency: Secondary | ICD-10-CM

## 2017-09-20 DIAGNOSIS — R197 Diarrhea, unspecified: Secondary | ICD-10-CM | POA: Diagnosis present

## 2017-09-20 DIAGNOSIS — I214 Non-ST elevation (NSTEMI) myocardial infarction: Secondary | ICD-10-CM

## 2017-09-20 DIAGNOSIS — Z7902 Long term (current) use of antithrombotics/antiplatelets: Secondary | ICD-10-CM | POA: Diagnosis not present

## 2017-09-20 DIAGNOSIS — Z9841 Cataract extraction status, right eye: Secondary | ICD-10-CM

## 2017-09-20 DIAGNOSIS — E785 Hyperlipidemia, unspecified: Secondary | ICD-10-CM | POA: Diagnosis present

## 2017-09-20 DIAGNOSIS — Z79899 Other long term (current) drug therapy: Secondary | ICD-10-CM | POA: Diagnosis not present

## 2017-09-20 DIAGNOSIS — L8990 Pressure ulcer of unspecified site, unspecified stage: Secondary | ICD-10-CM | POA: Diagnosis present

## 2017-09-20 DIAGNOSIS — R0609 Other forms of dyspnea: Secondary | ICD-10-CM | POA: Diagnosis not present

## 2017-09-20 DIAGNOSIS — Z87442 Personal history of urinary calculi: Secondary | ICD-10-CM

## 2017-09-20 DIAGNOSIS — Z888 Allergy status to other drugs, medicaments and biological substances status: Secondary | ICD-10-CM

## 2017-09-20 DIAGNOSIS — I77819 Aortic ectasia, unspecified site: Secondary | ICD-10-CM | POA: Diagnosis present

## 2017-09-20 DIAGNOSIS — I10 Essential (primary) hypertension: Secondary | ICD-10-CM | POA: Diagnosis present

## 2017-09-20 DIAGNOSIS — I509 Heart failure, unspecified: Secondary | ICD-10-CM | POA: Insufficient documentation

## 2017-09-20 DIAGNOSIS — E7800 Pure hypercholesterolemia, unspecified: Secondary | ICD-10-CM | POA: Diagnosis not present

## 2017-09-20 DIAGNOSIS — I5033 Acute on chronic diastolic (congestive) heart failure: Secondary | ICD-10-CM | POA: Diagnosis present

## 2017-09-20 DIAGNOSIS — R079 Chest pain, unspecified: Secondary | ICD-10-CM | POA: Diagnosis present

## 2017-09-20 DIAGNOSIS — I2510 Atherosclerotic heart disease of native coronary artery without angina pectoris: Secondary | ICD-10-CM | POA: Diagnosis present

## 2017-09-20 DIAGNOSIS — N179 Acute kidney failure, unspecified: Secondary | ICD-10-CM | POA: Diagnosis present

## 2017-09-20 DIAGNOSIS — R778 Other specified abnormalities of plasma proteins: Secondary | ICD-10-CM | POA: Diagnosis present

## 2017-09-20 DIAGNOSIS — Z8673 Personal history of transient ischemic attack (TIA), and cerebral infarction without residual deficits: Secondary | ICD-10-CM | POA: Diagnosis not present

## 2017-09-20 DIAGNOSIS — R7989 Other specified abnormal findings of blood chemistry: Secondary | ICD-10-CM | POA: Diagnosis present

## 2017-09-20 DIAGNOSIS — Z8701 Personal history of pneumonia (recurrent): Secondary | ICD-10-CM | POA: Diagnosis not present

## 2017-09-20 DIAGNOSIS — R748 Abnormal levels of other serum enzymes: Secondary | ICD-10-CM | POA: Diagnosis not present

## 2017-09-20 DIAGNOSIS — I350 Nonrheumatic aortic (valve) stenosis: Secondary | ICD-10-CM | POA: Diagnosis present

## 2017-09-20 DIAGNOSIS — I130 Hypertensive heart and chronic kidney disease with heart failure and stage 1 through stage 4 chronic kidney disease, or unspecified chronic kidney disease: Secondary | ICD-10-CM | POA: Diagnosis present

## 2017-09-20 DIAGNOSIS — I700 Atherosclerosis of aorta: Secondary | ICD-10-CM | POA: Diagnosis present

## 2017-09-20 DIAGNOSIS — Z9842 Cataract extraction status, left eye: Secondary | ICD-10-CM

## 2017-09-20 DIAGNOSIS — N170 Acute kidney failure with tubular necrosis: Secondary | ICD-10-CM | POA: Diagnosis not present

## 2017-09-20 DIAGNOSIS — J810 Acute pulmonary edema: Secondary | ICD-10-CM | POA: Diagnosis not present

## 2017-09-20 HISTORY — DX: Chest pain, unspecified: R07.9

## 2017-09-20 HISTORY — DX: Other forms of dyspnea: R06.09

## 2017-09-20 HISTORY — DX: Dyspnea, unspecified: R06.00

## 2017-09-20 HISTORY — DX: Chronic kidney disease, unspecified: N18.9

## 2017-09-20 LAB — HEPATIC FUNCTION PANEL
ALK PHOS: 99 U/L (ref 38–126)
ALT: 15 U/L (ref 0–44)
AST: 25 U/L (ref 15–41)
Albumin: 3.8 g/dL (ref 3.5–5.0)
BILIRUBIN INDIRECT: 0.6 mg/dL (ref 0.3–0.9)
BILIRUBIN TOTAL: 0.8 mg/dL (ref 0.3–1.2)
Bilirubin, Direct: 0.2 mg/dL (ref 0.0–0.2)
Total Protein: 6.8 g/dL (ref 6.5–8.1)

## 2017-09-20 LAB — BASIC METABOLIC PANEL
ANION GAP: 13 (ref 5–15)
BUN: 24 mg/dL — ABNORMAL HIGH (ref 8–23)
CALCIUM: 9 mg/dL (ref 8.9–10.3)
CO2: 22 mmol/L (ref 22–32)
Chloride: 105 mmol/L (ref 98–111)
Creatinine, Ser: 1.57 mg/dL — ABNORMAL HIGH (ref 0.44–1.00)
GFR, EST AFRICAN AMERICAN: 32 mL/min — AB (ref >60)
GFR, EST NON AFRICAN AMERICAN: 28 mL/min — AB (ref >60)
Glucose, Bld: 126 mg/dL — ABNORMAL HIGH (ref 70–99)
POTASSIUM: 3.1 mmol/L — AB (ref 3.5–5.1)
Sodium: 140 mmol/L (ref 135–145)

## 2017-09-20 LAB — TROPONIN I: Troponin I: 2.84 ng/mL (ref <0.03)

## 2017-09-20 LAB — CBC
HEMATOCRIT: 36.2 % (ref 34.0–46.6)
HEMATOCRIT: 39.6 % (ref 36.0–46.0)
Hemoglobin: 11.6 g/dL (ref 11.1–15.9)
Hemoglobin: 12.7 g/dL (ref 12.0–15.0)
MCH: 29 pg (ref 26.6–33.0)
MCH: 29.2 pg (ref 26.0–34.0)
MCHC: 32 g/dL (ref 31.5–35.7)
MCHC: 32.1 g/dL (ref 30.0–36.0)
MCV: 91 fL (ref 79–97)
MCV: 91 fL (ref 78.0–100.0)
PLATELETS: 178 10*3/uL (ref 150–400)
Platelets: 210 10*3/uL (ref 150–450)
RBC: 4 x10E6/uL (ref 3.77–5.28)
RBC: 4.35 MIL/uL (ref 3.87–5.11)
RDW: 14.8 % (ref 12.3–15.4)
RDW: 14.1 % (ref 11.5–15.5)
WBC: 6.7 10*3/uL (ref 3.4–10.8)
WBC: 9.2 10*3/uL (ref 4.0–10.5)

## 2017-09-20 LAB — I-STAT TROPONIN, ED: Troponin i, poc: 0.54 ng/mL (ref 0.00–0.08)

## 2017-09-20 LAB — ECHOCARDIOGRAM COMPLETE
Height: 63 in
WEIGHTICAEL: 2720 [oz_av]

## 2017-09-20 LAB — MAGNESIUM: Magnesium: 1.8 mg/dL (ref 1.7–2.4)

## 2017-09-20 LAB — MRSA PCR SCREENING: MRSA by PCR: NEGATIVE

## 2017-09-20 LAB — BRAIN NATRIURETIC PEPTIDE: B Natriuretic Peptide: 905.4 pg/mL — ABNORMAL HIGH (ref 0.0–100.0)

## 2017-09-20 MED ORDER — ALPRAZOLAM 0.25 MG PO TABS
0.2500 mg | ORAL_TABLET | Freq: Every day | ORAL | Status: DC
  Administered 2017-09-20 – 2017-09-22 (×3): 0.25 mg via ORAL
  Filled 2017-09-20 (×3): qty 1

## 2017-09-20 MED ORDER — HYDRALAZINE HCL 20 MG/ML IJ SOLN: 5.0000 mg | INTRAMUSCULAR | Status: DC | PRN

## 2017-09-20 MED ORDER — SODIUM CHLORIDE 0.9% FLUSH: 3.0000 mL | INTRAVENOUS | Status: DC | PRN

## 2017-09-20 MED ORDER — ACETAMINOPHEN 650 MG RE SUPP: 650.0000 mg | Freq: Four times a day (QID) | RECTAL | Status: DC | PRN

## 2017-09-20 MED ORDER — HEPARIN BOLUS VIA INFUSION
4000.0000 [IU] | Freq: Once | INTRAVENOUS | Status: AC
  Administered 2017-09-20: 4000 [IU] via INTRAVENOUS
  Filled 2017-09-20: qty 4000

## 2017-09-20 MED ORDER — DM-GUAIFENESIN ER 30-600 MG PO TB12: 1.0000 | ORAL_TABLET | Freq: Two times a day (BID) | ORAL | Status: DC | PRN

## 2017-09-20 MED ORDER — ONDANSETRON HCL 4 MG PO TABS: 4.0000 mg | ORAL_TABLET | Freq: Four times a day (QID) | ORAL | Status: DC | PRN

## 2017-09-20 MED ORDER — CLOPIDOGREL BISULFATE 75 MG PO TABS
75.0000 mg | ORAL_TABLET | Freq: Every day | ORAL | Status: DC
  Administered 2017-09-20 – 2017-09-21 (×2): 75 mg via ORAL
  Filled 2017-09-20 (×2): qty 1

## 2017-09-20 MED ORDER — IPRATROPIUM BROMIDE 0.06 % NA SOLN
2.0000 | Freq: Four times a day (QID) | NASAL | Status: DC
  Administered 2017-09-21 – 2017-09-23 (×5): 2 via NASAL
  Filled 2017-09-20: qty 15

## 2017-09-20 MED ORDER — ASPIRIN 81 MG PO CHEW
324.0000 mg | CHEWABLE_TABLET | Freq: Once | ORAL | Status: AC
  Administered 2017-09-20: 324 mg via ORAL
  Filled 2017-09-20: qty 4

## 2017-09-20 MED ORDER — SODIUM CHLORIDE 0.9 % IV SOLN: 250.0000 mL | INTRAVENOUS | Status: DC | PRN

## 2017-09-20 MED ORDER — TRAZODONE HCL 50 MG PO TABS: 25.0000 mg | ORAL_TABLET | Freq: Every evening | ORAL | Status: DC | PRN

## 2017-09-20 MED ORDER — POTASSIUM CHLORIDE CRYS ER 20 MEQ PO TBCR
40.0000 meq | EXTENDED_RELEASE_TABLET | Freq: Once | ORAL | Status: AC
  Administered 2017-09-20: 40 meq via ORAL
  Filled 2017-09-20: qty 2

## 2017-09-20 MED ORDER — SODIUM CHLORIDE 0.9% FLUSH
3.0000 mL | Freq: Two times a day (BID) | INTRAVENOUS | Status: DC
  Administered 2017-09-20 – 2017-09-21 (×3): 3 mL via INTRAVENOUS

## 2017-09-20 MED ORDER — FUROSEMIDE 10 MG/ML IJ SOLN
40.0000 mg | Freq: Once | INTRAMUSCULAR | Status: AC
  Administered 2017-09-20: 40 mg via INTRAVENOUS
  Filled 2017-09-20: qty 4

## 2017-09-20 MED ORDER — METOPROLOL SUCCINATE ER 50 MG PO TB24
50.0000 mg | ORAL_TABLET | Freq: Every day | ORAL | Status: DC
  Administered 2017-09-20 – 2017-09-23 (×4): 50 mg via ORAL
  Filled 2017-09-20 (×4): qty 1

## 2017-09-20 MED ORDER — ONDANSETRON HCL 4 MG/2ML IJ SOLN: 4.0000 mg | Freq: Four times a day (QID) | INTRAMUSCULAR | Status: DC | PRN

## 2017-09-20 MED ORDER — ACETAMINOPHEN 325 MG PO TABS: 650.0000 mg | ORAL_TABLET | Freq: Four times a day (QID) | ORAL | Status: DC | PRN

## 2017-09-20 MED ORDER — ASPIRIN EC 81 MG PO TBEC
81.0000 mg | DELAYED_RELEASE_TABLET | Freq: Every day | ORAL | Status: DC
  Administered 2017-09-21 – 2017-09-23 (×2): 81 mg via ORAL
  Filled 2017-09-20 (×3): qty 1

## 2017-09-20 MED ORDER — HEPARIN (PORCINE) IN NACL 100-0.45 UNIT/ML-% IJ SOLN
1000.0000 [IU]/h | INTRAMUSCULAR | Status: DC
  Administered 2017-09-20: 850 [IU]/h via INTRAVENOUS
  Administered 2017-09-21: 950 [IU]/h via INTRAVENOUS
  Filled 2017-09-20 (×2): qty 250

## 2017-09-20 NOTE — ED Provider Notes (Signed)
[facility] EM[facility] Provider Note   CSN: [id number] Arrival date & time: 09/20/17  0524     History   Chief Complaint Chief Complaint  Patient presents with  . Shortness of Breath    HPI Suzanne Howard is a 84 y.o. female.  HPI Patient presents to the emergency department with increasing exertional shortness of breath over the last 6 weeks.  The patient states that she was seen by her cardiologist yesterday who drew basic laboratory testing.  The patient states that last night she became more short of breath and was unable to lay flat.  The patient states that she does not normally require any oxygenation at home.  Patient states that nothing seemed to make the condition better.  She states that exertion seems to make her condition worse.  Patient states she did have some neck discomfort anteriorly in the midline during these episodes of shortness of breath.  The patient denies headache,blurred vision, neck pain, fever, cough, weakness, numbness, dizziness, anorexia, edema, abdominal pain, nausea, vomiting, diarrhea, rash, back pain, dysuria, hematemesis, bloody stool, near syncope, or syncope. Past Medical History:  Diagnosis Date  . Aortic stenosis   . CKD (chronic kidney disease)   . DOE (dyspnea on exertion)   . Exertional chest pain   . History of pneumonia    as a child  . Hyperlipidemia   . Hypertension   . Nephrolithiasis   . Stroke (HCC) 1998   no residual effects  . Thoracic spondylosis with myelopathy   . Thoracic stenosis     Patient Active Problem List   Diagnosis Date Noted  . Acute kidney injury (HCC) 09/20/2017  . Hypokalemia 09/20/2017  . Acute heart failure (HCC) 09/20/2017  . Exertional chest pain 09/20/2017  . Dyspnea on exertion   . Acute on chronic diastolic (congestive) heart failure (HCC)   . Elevated troponin   . Hypertension   . CKD (chronic kidney disease)   . Aortic stenosis 03/17/2014  . Essential  hypertension 03/17/2014  . Hyperlipidemia 03/17/2014    Past Surgical History:  Procedure Laterality Date  . APPENDECTOMY     as a teenager  . CATARACT EXTRACTION, BILATERAL    . ENDOMETRIAL ABLATION    . LITHOTRIPSY  2012  . THORACIC FUSION    . TONSILLECTOMY AND ADENOIDECTOMY       OB History   None      Home Medications    Prior to Admission medications   Medication Sig Start Date End Date Taking? Authorizing Provider  ALPRAZolam (XANAX) 0.25 MG tablet Take 0.25 mg by mouth at bedtime. 08/08/17  Yes [provider]  clopidogrel (PLAVIX) 75 MG tablet Take 1 tablet (75 mg total) by mouth daily. 12/15/14  Yes Lewayne Buntingrenshaw, Brian S, MD  dextromethorphan-guaiFENesin [facility](MUCINEX DM) 30-600 MG 12hr tablet Take 1 tablet by mouth 2 (two) times daily. Patient taking differently: Take 1 tablet by mouth 2 (two) times daily as needed.  07/04/15  Yes Ofilia Neaslark, Michael L, PA-C  ipratropium (ATROVENT) 0.06 % nasal spray Place 2 sprays into both nostrils 4 (four) times daily. Patient taking differently: Place 2 sprays into both nostrils as needed.  07/04/15  Yes Ofilia Neaslark, Michael L, PA-C  lisinopril-hydrochlorothiazide (PRINZIDE,ZESTORETIC) 20-25 MG tablet Take 1 tablet by mouth daily. 09/19/17  Yes Jodelle GrossLawrence, Kathryn M, NP  metoprolol succinate (TOPROL-XL) 50 MG 24 hr tablet Take 1 tablet (50 mg total) by mouth daily. Take with or immediately following a meal. 12/15/14  Yes Lewayne Bunting, MD  simvastatin (ZOCOR) 20 MG tablet Take 1 tablet by mouth at bedtime. 07/25/17  Yes [provider]    Family History Family History  Problem Relation Age of Onset  . Heart disease Father   . Anesthesia problems Neg Hx     Social History Social History   Tobacco Use  . Smoking status: Never Smoker  . Smokeless tobacco: Never Used  Substance Use Topics  . Alcohol use: No    Alcohol/week: 0.0 oz  . Drug use: No     Allergies   Betadine [povidone iodine]   Review of Systems Review of  Systems All other systems negative except as documented in the HPI. All pertinent positives and negatives as reviewed in the HPI.  Physical Exam Updated Vital Signs BP (!) 139/102   Pulse 85   Temp 98.1 F (36.7 C) (Oral)   Resp 17   Ht 5\' 3"  (1.6 m)   Wt 77.1 kg (170 lb)   SpO2 99%   BMI 30.11 kg/m   Physical Exam  Constitutional: She is oriented to person, place, and time. She appears well-developed and well-nourished. No distress.  HENT:  Head: Normocephalic and atraumatic.  Mouth/Throat: Oropharynx is clear and moist.  Eyes: Pupils are equal, round, and reactive to light.  Neck: Normal range of motion. Neck supple.  Cardiovascular: Normal rate, regular rhythm and normal heart sounds. Exam reveals no gallop and no friction rub.  No murmur heard. Pulmonary/Chest: Effort normal. No respiratory distress. She has decreased breath sounds in the right lower field and the left lower field. She has no wheezes. She has no rhonchi. She has no rales.  Abdominal: Soft. Bowel sounds are normal. She exhibits no distension. There is no tenderness.  Neurological: She is alert and oriented to person, place, and time. She exhibits normal muscle tone. Coordination normal.  Skin: Skin is warm and dry. Capillary refill takes less than 2 seconds. No rash noted. No erythema.  Psychiatric: She has a normal mood and affect. Her behavior is normal.  Nursing note and vitals reviewed.    ED Treatments / Results  Labs (all labs ordered are listed, but only abnormal results are displayed) Labs Reviewed  BASIC METABOLIC PANEL - Abnormal; Notable for the following components:      Result Value   Potassium 3.1 (*)    Glucose, Bld 126 (*)    BUN 24 (*)    Creatinine, Ser 1.57 (*)    GFR calc non Af Amer 28 (*)    GFR calc Af Amer 32 (*)    All other components within normal limits  BRAIN NATRIURETIC PEPTIDE - Abnormal; Notable for the following components:   B Natriuretic Peptide 905.4 (*)    All  other components within normal limits  I-STAT TROPONIN, ED - Abnormal; Notable for the following components:   Troponin i, poc 0.54 (*)    All other components within normal limits  GASTROINTESTINAL PANEL BY PCR, STOOL (REPLACES STOOL CULTURE)  CBC  MAGNESIUM  HEPATIC FUNCTION PANEL  HEPARIN LEVEL (UNFRACTIONATED)  TROPONIN I  TROPONIN I  TROPONIN I    EKG EKG Interpretation  Date/Time:  Wednesday September 20 2017 05:35:53 EDT Ventricular Rate:  101 PR Interval:    QRS Duration: 106 QT Interval:  362 QTC Calculation: 470 R Axis:   54 Text Interpretation:  Sinus tachycardia Probable anterior infarct, age indeterminate Confirmed by Palumbo, April ([id number]) on 09/20/2017 5:44:34 AM   Radiology Dg  Chest Portable 1 View  Result Date: 09/20/2017 CLINICAL DATA:  Subacute onset of respiratory difficulties and diarrhea. Wheezing. EXAM: PORTABLE CHEST 1 VIEW COMPARISON:  Chest radiograph performed 04/29/2011 FINDINGS: The lungs are well-aerated. Vascular congestion is noted. Bibasilar airspace opacities may reflect interstitial edema or possibly pneumonia. No significant pleural effusion or pneumothorax is seen. The cardiomediastinal silhouette is within normal limits. No acute osseous abnormalities are seen. Thoracolumbar spinal fusion hardware is partially imaged. IMPRESSION: Vascular congestion noted. Bibasilar airspace opacities may reflect interstitial edema or possibly pneumonia. Electronically Signed   By: Roanna Raider M.D.   On: 09/20/2017 06:07    Procedures Procedures (including critical care time)  Medications Ordered in ED Medications  potassium chloride SA (K-DUR,KLOR-CON) CR tablet 40 mEq (has no administration in time range)  furosemide (LASIX) injection 40 mg (has no administration in time range)  aspirin chewable tablet 324 mg (has no administration in time range)  aspirin EC tablet 81 mg (has no administration in time range)  ALPRAZolam (XANAX) tablet 0.25 mg (has no  administration in time range)  clopidogrel (PLAVIX) tablet 75 mg (has no administration in time range)  dextromethorphan-guaiFENesin (MUCINEX DM) 30-600 MG per 12 hr tablet 1 tablet (has no administration in time range)  ipratropium (ATROVENT) 0.06 % nasal spray 2 spray (has no administration in time range)  metoprolol succinate (TOPROL-XL) 24 hr tablet 50 mg (has no administration in time range)  sodium chloride flush (NS) 0.9 % injection 3 mL (has no administration in time range)  sodium chloride flush (NS) 0.9 % injection 3 mL (has no administration in time range)  0.9 %  sodium chloride infusion (has no administration in time range)  acetaminophen (TYLENOL) tablet 650 mg (has no administration in time range)    Or  acetaminophen (TYLENOL) suppository 650 mg (has no administration in time range)  traZODone (DESYREL) tablet 25 mg (has no administration in time range)  ondansetron (ZOFRAN) tablet 4 mg (has no administration in time range)    Or  ondansetron (ZOFRAN) injection 4 mg (has no administration in time range)  heparin ADULT infusion 100 units/mL (25000 units/241mL sodium chloride 0.45%) (has no administration in time range)  heparin bolus via infusion 4,000 Units (has no administration in time range)     Initial Impression / Assessment and Plan / ED Course  I have reviewed the triage vital signs and the nursing notes.  Pertinent labs & imaging results that were available during my care of the patient were reviewed by me and considered in my medical decision making (see chart for details).     Patient is found to have an elevated troponin along with BNP.  I spoke with cardiology who I feel needs to admit the patient for further evaluation of these values and her symptoms.  Cardiology called back later and stated that they felt that the hospitalist need to admit.  I spoke with the [facility] who will admit the patient for further evaluation and care.  Cardiology did see the  patient and will follow along with a treatment plan as well.  Final Clinical Impressions(s) / ED Diagnoses   Final diagnoses:  SOB (shortness of breath)    ED Discharge Orders    None       Charlestine Night, PA-C 09/20/17 1548    Gwyneth Sprout, MD 09/22/17 [phone]

## 2017-09-20 NOTE — ED Notes (Signed)
Patient transported to echocardiogram.  

## 2017-09-20 NOTE — ED Notes (Signed)
Dr. Mayford Knifeurner cardiology at the bedside.

## 2017-09-20 NOTE — Progress Notes (Signed)
Paged cardiology regarding elevated troponin  Awaiting call back

## 2017-09-20 NOTE — ED Notes (Signed)
X-ray at bedside

## 2017-09-20 NOTE — Progress Notes (Addendum)
Reviewed troponin and EKG with Dr. Mayford Knifeurner. We expected this level of trop  Elevation. EKG shows continued diffuse ST depression and accentuation of ST segment in lead III and avR similar to last 2 tracings. Nurse confirms pt remains pain free. She was fortunately quite comfortable in ER as well. Continue plan as discussed.  Dezman Granda PA-C

## 2017-09-20 NOTE — ED Notes (Signed)
Ebbie Ridgehris Lawyer PA-C made aware of patient's critical troponin of 0.54.

## 2017-09-20 NOTE — H&P (Addendum)
History and Physical    Suzanne Howard ZOX:[id number] DOB: Apr 05, 1924 DOA: 09/20/2017  PCP: Mila Palmer, MD [facility]  Chief Complaint: doe/exertional chest pain  HPI: Suzanne Howard is a 84 y.o. female with medical history significant moderate aortic stenosis, hypertension, hyperlipidemia, stroke, chronic kidney disease, presents to the emergency department with the chief complaint of shortness of breath and exertional chest pain. Initial evaluation concerning for progressive aortic stenosis vs possible ischemia in setting of possible acute heart failure and ?ACS. [facility] asked to admit  Information obtained from chart and patient. Recent reports a 6 week history of intermittent exertional dyspnea and chest comfort. She states that she has noted some chest pressure located left anterior nonradiating when she ambulates to the cafeteria. Associated symptoms include shortness of breath. She denies headache dizziness syncope or near-syncope. She denies diaphoresis nausea or vomiting. It's not every time but says at least once a day. She will sit down and it takes several minutes before symptoms resolve.in addition she states on occasion she notices some mild lower extremity edema. He was seen yesterday by nurse practitioner who ordered an echocardiogram as she was concern for progressive aortic stenosis. In addition her lisinopril and hydrochlorothiazide was increased but the patient has yet to start the higher dose. Last night she went to bed around 9:30 was unable to lie flat due to shortness of breath. She also" I felt restless". She states the more anxious he became too and developed chest discomfort which usually she only has on exertion. Now more Christell Constant was called she was provided with nebulizer for arbor wheezing. She denies fever chills headache cough. She denies dysuria hematuria frequency or urgency. She does indicate she's had ongoing diarrhea  for about a month. She states that she has loose stools 3-4 times a day. She denies any abdominal pain bloating cramping. She denies any bright red blood per rectum or melena. She states she is taking Imodium but this as not helped. She was seen by [facility] who checked a C. Difficile and Escherichia coli  ED Course: in the emergency department she's afebrile hemodynamically stable and not hypoxic. She is evaluated by cardiology who recommended a heparin drip IV Lasix potassium supplement echocardiagram and a medicine admission  Review of Systems: As per HPI otherwise all other systems reviewed and are negative.   Ambulatory Status: ambulates independently is independent with ADLs  Past Medical History:  Diagnosis Date  . Aortic stenosis   . CKD (chronic kidney disease)   . DOE (dyspnea on exertion)   . Exertional chest pain   . History of pneumonia    as a child  . Hyperlipidemia   . Hypertension   . Nephrolithiasis   . Stroke (HCC) 1998   no residual effects  . Thoracic spondylosis with myelopathy   . Thoracic stenosis     Past Surgical History:  Procedure Laterality Date  . APPENDECTOMY     as a teenager  . CATARACT EXTRACTION, BILATERAL    . ENDOMETRIAL ABLATION    . LITHOTRIPSY  2012  . THORACIC FUSION    . TONSILLECTOMY AND ADENOIDECTOMY      Social History   Socioeconomic History  . Marital status: Married    Spouse name: Not on file  . Number of children: 3  . Years of education: Not on file  . Highest education level: Not on file  Occupational History  . Not on file  Social Needs  .  Financial resource strain: Not on file  . Food insecurity:    Worry: Not on file    Inability: Not on file  . Transportation needs:    Medical: Not on file    Non-medical: Not on file  Tobacco Use  . Smoking status: Never Smoker  . Smokeless tobacco: Never Used  Substance and Sexual Activity  . Alcohol use: No    Alcohol/week: 0.0 oz  . Drug use: No  .  Sexual activity: Never    Birth control/protection: Post-menopausal  Lifestyle  . Physical activity:    Days per week: Not on file    Minutes per session: Not on file  . Stress: Not on file  Relationships  . Social connections:    Talks on phone: Not on file    Gets together: Not on file    Attends religious service: Not on file    Active member of club or organization: Not on file    Attends meetings of clubs or organizations: Not on file    Relationship status: Not on file  . Intimate partner violence:    Fear of current or ex partner: Not on file    Emotionally abused: Not on file    Physically abused: Not on file    Forced sexual activity: Not on file  Other Topics Concern  . Not on file  Social History Narrative  . Not on file    Allergies  Allergen Reactions  . Betadine [Povidone Iodine] Itching    Pt can tolerate shrimp and shellfish    Family History  Problem Relation Age of Onset  . Heart disease Father   . Anesthesia problems Neg Hx     Prior to Admission medications   Medication Sig Start Date End Date Taking? Authorizing Provider  ALPRAZolam (XANAX) 0.25 MG tablet Take 0.25 mg by mouth at bedtime. 08/08/17  Yes [provider]  clopidogrel (PLAVIX) 75 MG tablet Take 1 tablet (75 mg total) by mouth daily. 12/15/14  Yes Lewayne Bunting, MD  dextromethorphan-guaiFENesin [facility] DM) 30-600 MG 12hr tablet Take 1 tablet by mouth 2 (two) times daily. Patient taking differently: Take 1 tablet by mouth 2 (two) times daily as needed.  07/04/15  Yes Ofilia Neas, PA-C  ipratropium (ATROVENT) 0.06 % nasal spray Place 2 sprays into both nostrils 4 (four) times daily. Patient taking differently: Place 2 sprays into both nostrils as needed.  07/04/15  Yes Ofilia Neas, PA-C  lisinopril-hydrochlorothiazide (PRINZIDE,ZESTORETIC) 20-25 MG tablet Take 1 tablet by mouth daily. 09/19/17  Yes Jodelle Gross, NP  metoprolol succinate (TOPROL-XL) 50 MG 24 hr  tablet Take 1 tablet (50 mg total) by mouth daily. Take with or immediately following a meal. 12/15/14  Yes Crenshaw, Madolyn Frieze, MD  simvastatin (ZOCOR) 20 MG tablet Take 1 tablet by mouth at bedtime. 07/25/17  Yes [provider]    Physical Exam: Vitals:   09/20/17 1300 09/20/17 1356 09/20/17 1430 09/20/17 1445  BP: 134/68 122/71 120/73 (!) 139/102  Pulse: 80 82 83 85  Resp: 19 14 17 17   Temp:      TempSrc:      SpO2: 99% 98% 99% 99%  Weight:      Height:         General:  Appears calm and comfortable Eyes:  PERRL, EOMI, normal lids, iris ENT:  grossly normal hearing, lips & tongue, mmm Neck:  no LAD, masses or thyromegaly Cardiovascular:  RRR, no m/r/g. No LE  edema.  Respiratory:  Mild increased work of breathing. Decreased BS particularly on right. No wheeze. Faint crackles. Abdomen:  soft, ntnd, NABS Skin:  no rash or induration seen on limited exam Musculoskeletal:  grossly normal tone BUE/BLE, good ROM, no bony abnormality Psychiatric:  grossly normal mood and affect, speech fluent and appropriate, AOx3 Neurologic:  CN 2-12 grossly intact, moves all extremities in coordinated fashion, sensation intact  Labs on Admission: I have personally reviewed following labs and imaging studies  CBC: Recent Labs  Lab 09/19/17 1153 09/20/17 0612  WBC 6.7 9.2  HGB 11.6 12.7  HCT 36.2 39.6  MCV 91 91.0  PLT 210 178   Basic Metabolic Panel: Recent Labs  Lab 09/20/17 0612  NA 140  K 3.1*  CL 105  CO2 22  GLUCOSE 126*  BUN 24*  CREATININE 1.57*  CALCIUM 9.0  MG 1.8   GFR: Estimated Creatinine Clearance: 23 mL/min (A) (by C-G formula based on SCr of 1.57 mg/dL (H)). Liver Function Tests: Recent Labs  Lab 09/20/17 0612  AST 25  ALT 15  ALKPHOS 99  BILITOT 0.8  PROT 6.8  ALBUMIN 3.8   No results for input(s): LIPASE, AMYLASE in the last 168 hours. No results for input(s): AMMONIA in the last 168 hours. Coagulation Profile: No results for input(s): INR,  PROTIME in the last 168 hours. Cardiac Enzymes: No results for input(s): CKTOTAL, CKMB, CKMBINDEX, TROPONINI in the last 168 hours. BNP (last 3 results) No results for input(s): PROBNP in the last 8760 hours. HbA1C: No results for input(s): HGBA1C in the last 72 hours. CBG: No results for input(s): GLUCAP in the last 168 hours. Lipid Profile: No results for input(s): CHOL, HDL, LDLCALC, TRIG, CHOLHDL, LDLDIRECT in the last 72 hours. Thyroid Function Tests: No results for input(s): TSH, T4TOTAL, FREET4, T3FREE, THYROIDAB in the last 72 hours. Anemia Panel: No results for input(s): VITAMINB12, FOLATE, FERRITIN, TIBC, IRON, RETICCTPCT in the last 72 hours. Urine analysis:    Component Value Date/Time   COLORURINE YELLOW 04/29/2011 1404   APPEARANCEUR HAZY (A) 04/29/2011 1404   LABSPEC 1.021 04/29/2011 1404   PHURINE 5.5 04/29/2011 1404   GLUCOSEU NEGATIVE 04/29/2011 1404   HGBUR NEGATIVE 04/29/2011 1404   BILIRUBINUR neg 04/30/2012 1603   KETONESUR NEGATIVE 04/29/2011 1404   PROTEINUR neg 04/30/2012 1603   PROTEINUR NEGATIVE 04/29/2011 1404   UROBILINOGEN 0.2 04/30/2012 1603   UROBILINOGEN 0.2 04/29/2011 1404   NITRITE neg 04/30/2012 1603   NITRITE NEGATIVE 04/29/2011 1404   LEUKOCYTESUR Negative 04/30/2012 1603    Creatinine Clearance: Estimated Creatinine Clearance: 23 mL/min (A) (by C-G formula based on SCr of 1.57 mg/dL (H)).  Sepsis Labs: @LABRCNTIP (procalcitonin:4,lacticidven:4) )No results found for this or any previous visit (from the past 240 hour(s)).   Radiological Exams on Admission: Dg Chest Portable 1 View  Result Date: 09/20/2017 CLINICAL DATA:  Subacute onset of respiratory difficulties and diarrhea. Wheezing. EXAM: PORTABLE CHEST 1 VIEW COMPARISON:  Chest radiograph performed 04/29/2011 FINDINGS: The lungs are well-aerated. Vascular congestion is noted. Bibasilar airspace opacities may reflect interstitial edema or possibly pneumonia. No significant pleural  effusion or pneumothorax is seen. The cardiomediastinal silhouette is within normal limits. No acute osseous abnormalities are seen. Thoracolumbar spinal fusion hardware is partially imaged. IMPRESSION: Vascular congestion noted. Bibasilar airspace opacities may reflect interstitial edema or possibly pneumonia. Electronically Signed   By: Roanna Raider M.D.   On: 09/20/2017 06:07    EKG: Independently reviewed. Sinus tachycardia Probable anterior infarct, age indeterminate  Assessment/Plan Principal Problem:   Dyspnea on exertion Active Problems:   Aortic stenosis   Essential hypertension   Acute on chronic diastolic (congestive) heart failure (HCC)   Elevated troponin   Exertional chest pain   Acute kidney injury (HCC)   Hypokalemia   Hypertension   CKD (chronic kidney disease)   #1. Dyspnea on exertion/exertional chest pain/elevated troponin. No known CAD.echo done 2016 had an EF of 55-60% grade 1 diastolic dysfunction, mild/moderate aortic stenosis. ekg without acute changes, chest xray reveals vascular congestion by basilar airspace Opacities reflecting interstitial edema or possibly pneumonia. BNP 905 initial troponin 0 evaluated by cardiology who opined possible progressive aortic stenosis, possible ischemia, plus or minus new-onset CHF as well as ?ACS. Recommended medicine admission as well as: -telemetry -cycle troponin -heparin gtt -aspirin -continue plavix -continue metoprolol -hold lisinopril and hydrochlorothiazide -serial ekg -echocardiogram -lasix IV x1 per cards -intake and output -daily weights -appreciate cards assistance  #2. Acute kidney injury. Likely related to #1. Creatinine 1.57 on admission. Look for improvement with diuresis -Hold nephrotoxins -Monitor urine output -recheck in am -if no improvement, consider renal US  3. Hypokalemia. Patient describes ongoing diarrhea. Potassium level 3.1. ekg as noted. Of note magnesium 1.8 -replete per cards  instructions -consider magnesium repletion -recheck  #4. Diarrhea. Etiology unclear. Patient with a history for 4 weeks now. -GI pathogen panel -Monitor  #5. Hypertension. Only fair control in the emergency department. Home meds include lisinopril and hydrochlorothiazide as well as metoprolol -continue metoprolol -hold lisinopril and hydrochlorothiazide secondary to #2 -When necessary hydralazine   DVT prophylaxis: heparin  Code Status: dnr  Family Communication: none present  Disposition Plan: [facility]  Consults called: turner cards  Admission status: inpatient    Gwenyth BenderBLACK,KAREN M NP [facility]  If 7PM-7AM, please contact night-coverage [email or web address] Password [email or web address]  09/20/2017, 3:14 PM

## 2017-09-20 NOTE — Consult Note (Addendum)
Cardiology Consultation:   Patient ID: Suzanne Howard; [id number]; 19-Apr-1924   Admit date: 09/20/2017 Date of Consult: 09/20/2017  Primary Care Provider: Mila Palmer, MD Primary Cardiologist: Olga Millers, MD  Chief Complaint: shortness of breath and exertional chest discomfort  Patient Profile:   Suzanne Howard is a 84 y.o. female with a hx of Moderate aortic stenosis by echo 2015, mildly dilated ascending aorta 2016, HTN, HLD, nephrolithasis, stroke, thoracis stenosis, probable CKD stage III (baseline Cr 1) who is being seen today for the evaluation of shortness of breath/chest discomfort at the request of Dr. Anitra Lauth.  History of Present Illness:   Suzanne Howard lives in assisted living. She has no known history of coronary disease but last echo 2016 showed moderate LVH, EF 55-60%, grade 1 DD, mild-moderate aortic stenosis, mildly dilated 43mm ascending aorta. She was lost to follow-up until the other day when she called in requesting to be seen. She reports she has had 6 weeks of exertional dyspnea and chest discomfort coming on even with minimal activity. It takes several minutes of resting before it resolves. She also has noticed mild lower extremity edema which is not apparent by exam today. She also has been troubled by persistent diarrhea for 1 months' time going 3-4 times of loose stools. Minimal abd pain. No melena or BRBPR. Immodium provided no relief. Has seen [facility] whom she states checked C diff and E Coli but they had not heard back regarding results. At [facility] yesterday, Joni Reining checked a CBC which was normal and arranged for echocardiogram for concern of progressive aortic stenosis. Her lisinopril/HCTZ was increased from 20/12.5mg  daily to 20/25mg  - the patient did not start the higher tablet yet but did take an extra lisinopril/HCTZ tablet last night. Last night she went to bed around 9:30 but felt restless with SOB even sitting still, with rest chest  discomfort as well. Usually before now it was always only exertional. She notified her facility staff who came and applied O2 and called 911. Per ED triage notes, received 10 of albuterol and 0.5 of atrovent for diffuse wheezing which improved her symptoms. No fevers or chills but did feel "hot" yesterday. Denies any orthopnea except yesterday was SOB no matter how she was positioned. Workup in ER shows CXR with vascular congestion; bibasilar airspace opacities may reflect interstitial edema or possibly pneumonia. Labs reveal normal CBC, AKI with BUN 24/Cr 1.57, K 3.1, BNP 905, troponin 0.54, BP labile from 156/99 on arrival to 110/63 currently. EKG shows sinus tach 101bpm with possible age indeterminate anterior infarct and diffuse ST depression/TW changes. She is free of symptoms while resting comfortably in bed lying about 20 degrees.   Past Medical History:  Diagnosis Date  . Aortic stenosis   . History of pneumonia    as a child  . Hyperlipidemia   . Hypertension   . Nephrolithiasis   . Stroke (HCC) 1998   no residual effects  . Thoracic spondylosis with myelopathy   . Thoracic stenosis     Past Surgical History:  Procedure Laterality Date  . APPENDECTOMY     as a teenager  . CATARACT EXTRACTION, BILATERAL    . ENDOMETRIAL ABLATION    . LITHOTRIPSY  2012  . THORACIC FUSION    . TONSILLECTOMY AND ADENOIDECTOMY       Inpatient Medications: Scheduled Meds: . potassium chloride  40 mEq Oral Once   Continuous Infusions:  PRN Meds:   Home Meds: Prior to Admission  medications   Medication Sig Start Date End Date Taking? Authorizing Provider  ALPRAZolam (XANAX) 0.25 MG tablet Take 0.25 mg by mouth at bedtime. 08/08/17  Yes [provider]  clopidogrel (PLAVIX) 75 MG tablet Take 1 tablet (75 mg total) by mouth daily. 12/15/14  Yes Lewayne Bunting, MD  dextromethorphan-guaiFENesin [facility] DM) 30-600 MG 12hr tablet Take 1 tablet by mouth 2 (two) times daily. Patient  taking differently: Take 1 tablet by mouth 2 (two) times daily as needed.  07/04/15  Yes Ofilia Neas, PA-C  ipratropium (ATROVENT) 0.06 % nasal spray Place 2 sprays into both nostrils 4 (four) times daily. Patient taking differently: Place 2 sprays into both nostrils as needed.  07/04/15  Yes Ofilia Neas, PA-C  lisinopril-hydrochlorothiazide (PRINZIDE,ZESTORETIC) 20-25 MG tablet Take 1 tablet by mouth daily. 09/19/17  Yes Jodelle Gross, NP  metoprolol succinate (TOPROL-XL) 50 MG 24 hr tablet Take 1 tablet (50 mg total) by mouth daily. Take with or immediately following a meal. 12/15/14  Yes Crenshaw, Madolyn Frieze, MD  simvastatin (ZOCOR) 20 MG tablet Take 1 tablet by mouth at bedtime. 07/25/17  Yes [provider]    Allergies:    Allergies  Allergen Reactions  . Betadine [Povidone Iodine] Itching    Pt can tolerate shrimp and shellfish    Social History:   Social History   Socioeconomic History  . Marital status: Married    Spouse name: Not on file  . Number of children: 3  . Years of education: Not on file  . Highest education level: Not on file  Occupational History  . Not on file  Social Needs  . Financial resource strain: Not on file  . Food insecurity:    Worry: Not on file    Inability: Not on file  . Transportation needs:    Medical: Not on file    Non-medical: Not on file  Tobacco Use  . Smoking status: Never Smoker  . Smokeless tobacco: Never Used  Substance and Sexual Activity  . Alcohol use: No    Alcohol/week: 0.0 oz  . Drug use: No  . Sexual activity: Never    Birth control/protection: Post-menopausal  Lifestyle  . Physical activity:    Days per week: Not on file    Minutes per session: Not on file  . Stress: Not on file  Relationships  . Social connections:    Talks on phone: Not on file    Gets together: Not on file    Attends religious service: Not on file    Active member of club or organization: Not on file    Attends meetings of  clubs or organizations: Not on file    Relationship status: Not on file  . Intimate partner violence:    Fear of current or ex partner: Not on file    Emotionally abused: Not on file    Physically abused: Not on file    Forced sexual activity: Not on file  Other Topics Concern  . Not on file  Social History Narrative  . Not on file    Family History:   The patient's family history includes Heart disease in her father. There is no history of Anesthesia problems.  ROS:  Please see the history of present illness.  All other ROS reviewed and negative.     Physical Exam/Data:   Vitals:   09/20/17 1045 09/20/17 1100 09/20/17 1245 09/20/17 1300  BP: (!) 112/59 110/63 126/70 134/68  Pulse: 88  95 86 80  Resp: 19 18 20 19   Temp:      TempSrc:      SpO2: 98% 100% 97% 99%  Weight:      Height:       No intake or output data in the 24 hours ending 09/20/17 1321 Filed Weights   09/20/17 0531  Weight: 170 lb (77.1 kg)   Body mass index is 30.11 kg/m.  General: Well developed, well nourished WF in no acute distress. Head: Normocephalic, atraumatic, sclera non-icteric, no xanthomas, nares are without discharge. Neck: Negative for carotid bruits. JVD not elevated. Lungs: Bilateral crackles 1/3 way up. No wheezes or rhonchi. Breathing is unlabored. Heart: RRR with higher pitched SEM at RUSB, also heard LSB, S2 is faint. No rubs, or gallops appreciated. Abdomen: Soft, non-tender, non-distended with normoactive bowel sounds. No hepatomegaly. No rebound/guarding. No obvious abdominal masses. Msk:  Strength and tone appear normal for age. Extremities: No clubbing or cyanosis. No edema.  Distal pedal pulses are 2+ and equal bilaterally. Neuro: Alert and oriented X 3. No facial asymmetry. No focal deficit. Moves all extremities spontaneously. Psych:  Responds to questions appropriately with a normal affect.  EKG:  The EKG was personally reviewed and demonstrates sinus tach 101bpm with  possible age indeterminate anterior infarct and diffuse ST depression/TW changes.  Relevant CV Studies: Pending echo as OP  Laboratory Data:  Chemistry Recent Labs  Lab 09/20/17 0612  NA 140  K 3.1*  CL 105  CO2 22  GLUCOSE 126*  BUN 24*  CREATININE 1.57*  CALCIUM 9.0  GFRNONAA 28*  GFRAA 32*  ANIONGAP 13    No results for input(s): PROT, ALBUMIN, AST, ALT, ALKPHOS, BILITOT in the last 168 hours. Hematology Recent Labs  Lab 09/19/17 1153 09/20/17 0612  WBC 6.7 9.2  RBC 4.00 4.35  HGB 11.6 12.7  HCT 36.2 39.6  MCV 91 91.0  MCH 29.0 29.2  MCHC 32.0 32.1  RDW 14.8 14.1  PLT 210 178   Cardiac EnzymesNo results for input(s): TROPONINI in the last 168 hours.  Recent Labs  Lab 09/20/17 0729  TROPIPOC 0.54*    BNP Recent Labs  Lab 09/20/17 0726  BNP 905.4*    DDimer No results for input(s): DDIMER in the last 168 hours.  Radiology/Studies:  Dg Chest Portable 1 View  Result Date: 09/20/2017 CLINICAL DATA:  Subacute onset of respiratory difficulties and diarrhea. Wheezing. EXAM: PORTABLE CHEST 1 VIEW COMPARISON:  Chest radiograph performed 04/29/2011 FINDINGS: The lungs are well-aerated. Vascular congestion is noted. Bibasilar airspace opacities may reflect interstitial edema or possibly pneumonia. No significant pleural effusion or pneumothorax is seen. The cardiomediastinal silhouette is within normal limits. No acute osseous abnormalities are seen. Thoracolumbar spinal fusion hardware is partially imaged. IMPRESSION: Vascular congestion noted. Bibasilar airspace opacities may reflect interstitial edema or possibly pneumonia. Electronically Signed   By: Roanna RaiderJeffery  Chang M.D.   On: 09/20/2017 06:07    Assessment and Plan:   1. Shortness of breath with concern for progressive aortic stenosis, possible ischemia, +/- new onset CHF (updated LVEF unknown) - share concern that AS has progressed based on exam, although S2 is still very faint. She seems like she would be a  candidate for TAVR if this were indicated. Would also need to consider cardiac cath if she remains stable. Suggest admission to hospitalist service given advanced age and comorbid conditions, also needs workup of diarrhea prior to surgical treatment if that's what is deemed necessary. Her EKG looks globally  ischemic. Start aspirin. She is already on Plavix for her history of stroke. Would suggest continuing beta blocker on admission. Will start heparin per pharmacy and follow troponins. Will dose IV Lasix 40mg  x 1 an hour after potassium given edema on CXR, need to follow renal function closely before re-dosing. 2. Acute kidney injury superimposed on CKD - suggest to hold lisinopril/HCTZ for now and follow. 3. Hypokalemia - give KCl x1 now and another in 3 hours. CrCl is only 23 so use caution with repletion. 4. Diarrhea - appreciate IM w/u. Clearly troublesome for patient and not really related to her acute cardiac issues. 5. Dilated ascending aorta - this will be reassessed with echocardiogram. No acute signs of dissection - sx have been smoldering and she is now asymptomatic. 6. Essential HTN - follow BP during admission. 7. Hyperlipidemia - hold statin for now until LFTs assessed given recent diarrhea illness. Can resume in AM if stable.  Code status: reviewed options with patient. She would like to continue current workup and medical interventions and consideration of TAVR if necessary, but does confirm she wishes to be DNR/DNI.  For questions or updates, please contact [facility] Please consult [email or web address] for contact info under Cardiology/STEMI.    Signed, Laurann Montana, PA-C  09/20/2017 1:21 PM

## 2017-09-20 NOTE — ED Notes (Signed)
Pt 93% on RA. Pt placed on 2LNC and now 95%. Will continue to monitor.

## 2017-09-20 NOTE — Progress Notes (Signed)
ANTICOAGULATION CONSULT NOTE - Initial Consult  Pharmacy Consult for heparin Indication: chest pain/ACS  Patient Measurements: Height: 5\' 3"  (160 cm) Weight: 170 lb (77.1 kg) IBW/kg (Calculated) : 52.4 Heparin Dosing Weight: 69 kg  Vital Signs: Temp: 98.1 F (36.7 C) (08/07 0533) Temp Source: Oral (08/07 0533) BP: 122/71 (08/07 1356) Pulse Rate: 82 (08/07 1356)    Medical History: Past Medical History:  Diagnosis Date  . Aortic stenosis   . History of pneumonia    as a child  . Hyperlipidemia   . Hypertension   . Nephrolithiasis   . Stroke (HCC) 1998   no residual effects  . Thoracic spondylosis with myelopathy   . Thoracic stenosis     Assessment: Heparin gtt for ACS Trop positive No anticoagulants PTA SCR 1.5, cbc ok  Goal of Therapy:  Heparin level 0.3-0.7 units/ml Monitor platelets by anticoagulation protocol: Yes    Plan:  -Heparin bolus 4000 units x1 then 850 units/hr -Daily HL, CBC -Check level in 8 hours   Baldemar FridayMasters, Abdulraheem Pineo M 09/20/2017,2:43 PM

## 2017-09-20 NOTE — ED Notes (Signed)
Ebbie Ridgehris Lawyer PA-C speaking with patient and family

## 2017-09-20 NOTE — ED Notes (Signed)
Patient placed on purwick

## 2017-09-20 NOTE — Progress Notes (Signed)
  Echocardiogram 2D Echocardiogram has been performed.  Suzanne SavoyCasey N Howard Suzanne Howard 09/20/2017, 3:44 PM

## 2017-09-20 NOTE — Progress Notes (Signed)
PA Dunn aware of ekg and troponin  No new orders will continue to monitor

## 2017-09-20 NOTE — ED Triage Notes (Addendum)
Per [facility], pt arrives from independent living facility. Pt endorses respiratory difficulties x2 weeks and diarrhea x3 weeks. SOB worsens when exerting and lying flat. EMS reports wheezing all quadrants and gave 10 of albuterol and 0.5 of atrovent. Fire reports patient was 92% on RA. Pt reports being placed on imodium for diarrhea with no improvement and seeing [facility] and tests for e coli being negative.

## 2017-09-21 DIAGNOSIS — I10 Essential (primary) hypertension: Secondary | ICD-10-CM

## 2017-09-21 DIAGNOSIS — N179 Acute kidney failure, unspecified: Secondary | ICD-10-CM

## 2017-09-21 DIAGNOSIS — R0609 Other forms of dyspnea: Secondary | ICD-10-CM

## 2017-09-21 DIAGNOSIS — I214 Non-ST elevation (NSTEMI) myocardial infarction: Principal | ICD-10-CM

## 2017-09-21 DIAGNOSIS — L8990 Pressure ulcer of unspecified site, unspecified stage: Secondary | ICD-10-CM

## 2017-09-21 LAB — CBC
HCT: 33.2 % — ABNORMAL LOW (ref 36.0–46.0)
Hemoglobin: 11 g/dL — ABNORMAL LOW (ref 12.0–15.0)
MCH: 29.6 pg (ref 26.0–34.0)
MCHC: 33.1 g/dL (ref 30.0–36.0)
MCV: 89.5 fL (ref 78.0–100.0)
Platelets: 153 10*3/uL (ref 150–400)
RBC: 3.71 MIL/uL — AB (ref 3.87–5.11)
RDW: 14.2 % (ref 11.5–15.5)
WBC: 5.3 10*3/uL (ref 4.0–10.5)

## 2017-09-21 LAB — HEPARIN LEVEL (UNFRACTIONATED)
Heparin Unfractionated: 0.22 IU/mL — ABNORMAL LOW (ref 0.30–0.70)
Heparin Unfractionated: 0.32 IU/mL (ref 0.30–0.70)

## 2017-09-21 LAB — BASIC METABOLIC PANEL
ANION GAP: 11 (ref 5–15)
BUN: 22 mg/dL (ref 8–23)
CHLORIDE: 106 mmol/L (ref 98–111)
CO2: 24 mmol/L (ref 22–32)
Calcium: 8.6 mg/dL — ABNORMAL LOW (ref 8.9–10.3)
Creatinine, Ser: 1.51 mg/dL — ABNORMAL HIGH (ref 0.44–1.00)
GFR calc non Af Amer: 29 mL/min — ABNORMAL LOW (ref >60)
GFR, EST AFRICAN AMERICAN: 34 mL/min — AB (ref >60)
Glucose, Bld: 99 mg/dL (ref 70–99)
POTASSIUM: 3.4 mmol/L — AB (ref 3.5–5.1)
Sodium: 141 mmol/L (ref 135–145)

## 2017-09-21 LAB — TROPONIN I
Troponin I: 2.53 ng/mL (ref <0.03)
Troponin I: 2.52 ng/mL (ref <0.03)

## 2017-09-21 MED ORDER — POTASSIUM CHLORIDE CRYS ER 20 MEQ PO TBCR
20.0000 meq | EXTENDED_RELEASE_TABLET | Freq: Every day | ORAL | Status: DC
  Administered 2017-09-21 – 2017-09-23 (×2): 20 meq via ORAL
  Filled 2017-09-21 (×2): qty 1

## 2017-09-21 MED ORDER — SIMVASTATIN 20 MG PO TABS
20.0000 mg | ORAL_TABLET | Freq: Every day | ORAL | Status: DC
  Administered 2017-09-21 – 2017-09-22 (×2): 20 mg via ORAL
  Filled 2017-09-21 (×2): qty 1

## 2017-09-21 MED ORDER — FUROSEMIDE 20 MG PO TABS
20.0000 mg | ORAL_TABLET | Freq: Every day | ORAL | Status: DC
  Administered 2017-09-21: 20 mg via ORAL
  Filled 2017-09-21: qty 1

## 2017-09-21 NOTE — Progress Notes (Signed)
Nutrition Brief Note  Patient identified on the Malnutrition Screening Tool (MST) Report  Wt Readings from Last 15 Encounters:  09/21/17 78.7 kg  09/19/17 80.1 kg  09/12/15 85 kg  07/04/15 85.5 kg  02/03/15 87.5 kg  12/15/14 86.7 kg  03/17/14 82.1 kg  04/08/13 90.6 kg  04/30/12 82.7 kg  03/19/12 85.5 kg  05/03/11 86.2 kg  04/29/11 3986.772 kg   84 year old with dyspnea on exertion and shortness of breath of 6-week duration, echo from 09/20/2017 with moderate aortic stenosis and preserved EF of 55%.   Pt admitted with SOB and chest pain.   Spoke with pt and daughter at bedside. Pt reports she resides at [facility]F [facility]). She is on a meal plan where she receives one per day day at the facility. Per pt, she usually consumes oatmeal/dry cereal and a boiled egg for breakfast and a soup or sandwich for a later lunch. She denies any changes in her appetite, however, daughter believes she has been eating less over the past 3 months and has lost approximately 3-4# during this time period. However, this is not consistent with wt hx and limited wt hx available to assess wt changes.   Pt estimates she consumed 50-75% of her breakfast this morning and is requesting a "light lunch" due to breakfast tray being delivered late. RD personally checked meal ordering system to ensure pt lunch order was printed.    Pt denies any changes in her mobility.  Nutrition-Focused physical exam completed. Findings are no fat depletion, no muscle depletion, and mild edema.   Pt denies any further nutrition-related questions or needs, but expressed appreciation for visit. Discussed importance of good nutritional intake to support healing.   Body mass index is 30.73 kg/m. Patient meets criteria for obesity, class I based on current BMI.   Current diet order is Heart Healthy, patient is consuming approximately 75% of meals at this time. Labs and medications reviewed.   No nutrition interventions warranted at this  time. If nutrition issues arise, please consult RD.   Frida Wahlstrom A. Mayford KnifeWilliams, RD, LDN, CDE Pager: [phone] After hours Pager: [phone]

## 2017-09-21 NOTE — Progress Notes (Signed)
ANTICOAGULATION CONSULT NOTE Pharmacy Consult for heparin Indication: chest pain/ACS  Patient Measurements: Height: 5\' 3"  (160 cm) Weight: 170 lb (77.1 kg) IBW/kg (Calculated) : 52.4 Heparin Dosing Weight: 69 kg  Vital Signs: Temp: 98.9 F (37.2 C) (08/07 2158) Temp Source: Oral (08/07 2158) BP: 127/87 (08/07 1702) Pulse Rate: 89 (08/07 2158)  Assessment: 84 y.o. female with chest pain for heparin   Goal of Therapy:  Heparin level 0.3-0.7 units/ml Monitor platelets by anticoagulation protocol: Yes  Plan:  Increase Heparin 950 units/hr Check heparin level in 8 hours.   Suzanne Howard, Suzanne Howard 09/21/2017,3:04 AM

## 2017-09-21 NOTE — H&P (View-Only) (Signed)
 Progress Note  Patient Name: Suzanne Howard Date of Encounter: 09/21/2017  Primary Cardiologist: Brian Crenshaw, MD   Subjective   She denies any chest pain or shortness of breath.  Telemetry shows normal sinus rhythm with occasional sinus tachycardia  Inpatient Medications    Scheduled Meds: . ALPRAZolam  0.25 mg Oral QHS  . aspirin EC  81 mg Oral Daily  . clopidogrel  75 mg Oral Daily  . ipratropium  2 spray Each Nare QID  . metoprolol succinate  50 mg Oral Daily  . sodium chloride flush  3 mL Intravenous Q12H   Continuous Infusions: . sodium chloride    . heparin 950 Units/hr (09/21/17 0309)   PRN Meds: sodium chloride, acetaminophen **OR** acetaminophen, dextromethorphan-guaiFENesin, hydrALAZINE, ondansetron **OR** ondansetron (ZOFRAN) IV, sodium chloride flush, traZODone   Vital Signs    Vitals:   09/20/17 1702 09/20/17 2158 09/21/17 0500 09/21/17 0551  BP: 127/87   117/62  Pulse: 93 89  87  Resp: 20 20  20  Temp: 98.7 F (37.1 C) 98.9 F (37.2 C)  97.7 F (36.5 C)  TempSrc: Oral Oral  Oral  SpO2: 100% 96%  97%  Weight:   78.7 kg 78.7 kg  Height:        Intake/Output Summary (Last 24 hours) at 09/21/2017 0924 Last data filed at 09/21/2017 0334 Gross per 24 hour  Intake 139.63 ml  Output -  Net 139.63 ml   Filed Weights   09/20/17 0531 09/21/17 0500 09/21/17 0551  Weight: 77.1 kg 78.7 kg 78.7 kg    Telemetry    Normal sinus rhythm with occasional sinus tachycardia- Personally Reviewed  ECG    No new EKG to review- Personally Reviewed  Physical Exam   GEN: No acute distress.   Neck: No JVD Cardiac: RRR, no rubs, or gallops.  2/6 mid peaking systolic murmur the right upper sternal border rating to left lower sternal border Respiratory: Clear to auscultation bilaterally. GI: Soft, nontender, non-distended  MS: No edema; No deformity. Neuro:  Nonfocal  Psych: Normal affect   Labs    Chemistry Recent Labs  Lab 09/20/17 0612  09/21/17 0205  NA 140 141  K 3.1* 3.4*  CL 105 106  CO2 22 24  GLUCOSE 126* 99  BUN 24* 22  CREATININE 1.57* 1.51*  CALCIUM 9.0 8.6*  PROT 6.8  --   ALBUMIN 3.8  --   AST 25  --   ALT 15  --   ALKPHOS 99  --   BILITOT 0.8  --   GFRNONAA 28* 29*  GFRAA 32* 34*  ANIONGAP 13 11     Hematology Recent Labs  Lab 09/19/17 1153 09/20/17 0612 09/21/17 0205  WBC 6.7 9.2 5.3  RBC 4.00 4.35 3.71*  HGB 11.6 12.7 11.0*  HCT 36.2 39.6 33.2*  MCV 91 91.0 89.5  MCH 29.0 29.2 29.6  MCHC 32.0 32.1 33.1  RDW 14.8 14.1 14.2  PLT 210 178 153    Cardiac Enzymes Recent Labs  Lab 09/20/17 1606 09/20/17 2315 09/21/17 0205  TROPONINI 2.84* 2.53* 2.52*    Recent Labs  Lab 09/20/17 0729  TROPIPOC 0.54*     BNP Recent Labs  Lab 09/20/17 0726  BNP 905.4*     DDimer No results for input(s): DDIMER in the last 168 hours.   Radiology    Dg Chest Portable 1 View  Result Date: 09/20/2017 CLINICAL DATA:  Subacute onset of respiratory difficulties and diarrhea. Wheezing. EXAM: PORTABLE   CHEST 1 VIEW COMPARISON:  Chest radiograph performed 04/29/2011 FINDINGS: The lungs are well-aerated. Vascular congestion is noted. Bibasilar airspace opacities may reflect interstitial edema or possibly pneumonia. No significant pleural effusion or pneumothorax is seen. The cardiomediastinal silhouette is within normal limits. No acute osseous abnormalities are seen. Thoracolumbar spinal fusion hardware is partially imaged. IMPRESSION: Vascular congestion noted. Bibasilar airspace opacities may reflect interstitial edema or possibly pneumonia. Electronically Signed   By: Jeffery  Chang M.D.   On: 09/20/2017 06:07    Cardiac Studies   2D echo  Study Conclusions  - Left ventricle: The cavity size was normal. Wall thickness was   increased in a pattern of mild LVH. The estimated ejection   fraction was 55%. Although no diagnostic regional wall motion   abnormality was identified, this possibility  cannot be completely   excluded on the basis of this study. Doppler parameters are   consistent with abnormal left ventricular relaxation (grade 1   diastolic dysfunction). - Aortic valve: Trileaflet; severely calcified leaflets. There was   moderate stenosis. Mean gradient (S): 29 mm Hg. Peak gradient   (S): 44 mm Hg. Valve area (VTI): 1.2 cm^2. - Mitral valve: Moderately calcified annulus. There was no evidence   for stenosis. There was mild regurgitation. Mean gradient (D): 3   mm Hg. Valve area by pressure half-time: 2.88 cm^2. - Left atrium: The atrium was severely dilated. - Right ventricle: The cavity size was normal. Systolic function   was normal. - Pulmonary arteries: No complete TR doppler jet so unable to   estimate PA systolic pressure. - Inferior vena cava: The vessel was normal in size. The   respirophasic diameter changes were in the normal range (>= 50%),   consistent with normal central venous pressure.  Impressions:  - Normal LV size with mild LV hypertrophy. EF 55%. Normal RV size   and systolic function. Moderate aortic stenosis. Mild mitral   regurgitation. LAE.   Patient Profile     84 y.o. female with a history of mild to moderate AS by last echo 2016 and normal LVF, mildly dilated ascending aorta.  She has not been seen in several years and about 6 weeks ago starting having problems with increased LE edema, DOE, and exertional throat pain.  She denies any actual CP but complains mainly of exertional throat pain. with a history of mild to moderate AS by last echo 2016 and normal LVF, mildly dilated ascending aorta.  She has not been seen in several years and about 6 weeks ago starting having problems with increased LE edema, DOE, and exertional throat pain.  She denies any actual CP but complains mainly of exertional throat pain.   Assessment & Plan    1. Acute diastolic CHF  -Likely related to combination of moderate aortic stenosis and possible underlying  coronary ischemia -EKG showed marked ST depression throughout the precordial and inferior leads.  Troponin also elevated at >2 -LV function normal on echo with only moderate aortic stenosis and therefore suspect this may be ischemia related -She responded well to Lasix 40 mg IV yesterday with resolution of her shortness of breath.  She was able to lie flat with no oxygen last night to sleep. -I&O's are incomplete and weight is unchanged -Creatinine slightly improved at 1.  At 1 (1.57>1.51) -She appears euvolemic on exam with clear lungs and no lower extremity edema -Start Lasix 20 mg p.o. Daily  2.  Hypokalemia -likely related to a combination of diuretic therapy and   recent diarrhea -Replete to keep potassium greater than 4  3.  NSTEMI -Troponin elevated at 0.54>2.84>2.53>2.52 -With flat trend this could be demand ischemia but suspect given her markedly ischemic looking EKG that she has underlying CAD -Would recommend right and left heart catheterization but will plan on this tomorrow since she does have some mild chronic kidney disease and got her ACE inhibitor yesterday -ACE inhibitor now on hold -Continue IV heparin drip, beta-blocker, Plavix 75 mg daily which she is on for history of CVA and aspirin 81 mg daily. -Cardiac catheterization was discussed with the patient fully. The patient understands that risks include but are not limited to stroke (1 in 1000), death (1 in 1000), kidney failure [usually temporary] (1 in 500), bleeding (1 in 200), allergic reaction [possibly serious] (1 in 200).  The patient understands and is willing to proceed.    4.  Moderate aortic stenosis -2D echothis admission showed moderate aortic stenosis with a mean aortic valve gradient of 26 mmHg, aortic valve area 1.2 cm and dimensionless index 0.27 all consistent with moderate aortic stenosis  5.  Diarrhea - appreciate IM w/u.  -clearly troublesome for patient and not really related to her acute cardiac  issues.  6.  Dilated ascending aorta  -2D echocardiogram did not show any dilatation but once renal function has improved consider chest CT angios for complete evaluation  7.  Essential HTN  -BP well controlled at 127/80 mmHg -Continue beta-blocker -ACE on hold due to acute on chronic kidney disease  8.  Hyperlipidemia  -LFTs normal so will restart statin  I have spent a total of 40 minutes with patient reviewing 2d echo , telemetry, EKGs, labs and examining patient as well as establishing an assessment and plan including cath procedure and risk/benifites of procedure that was discussed with the patient.  > 50% of time was spent in direct patient care.    For questions or updates, please contact [facility] Please consult [email or web address] for contact info under Cardiology/STEMI.      Signed, Traci Turner, MD  09/21/2017, 9:24 AM   

## 2017-09-21 NOTE — Progress Notes (Addendum)
Progress Note  Patient Name: Suzanne Howard Date of Encounter: 09/21/2017  Primary Cardiologist: Olga Millers, MD   Subjective   She denies any chest pain or shortness of breath.  Telemetry shows normal sinus rhythm with occasional sinus tachycardia  Inpatient Medications    Scheduled Meds: . ALPRAZolam  0.25 mg Oral QHS  . aspirin EC  81 mg Oral Daily  . clopidogrel  75 mg Oral Daily  . ipratropium  2 spray Each Nare QID  . metoprolol succinate  50 mg Oral Daily  . sodium chloride flush  3 mL Intravenous Q12H   Continuous Infusions: . sodium chloride    . heparin 950 Units/hr (09/21/17 0309)   PRN Meds: sodium chloride, acetaminophen **OR** acetaminophen, dextromethorphan-guaiFENesin, hydrALAZINE, ondansetron **OR** ondansetron (ZOFRAN) IV, sodium chloride flush, traZODone   Vital Signs    Vitals:   09/20/17 1702 09/20/17 2158 09/21/17 0500 09/21/17 0551  BP: 127/87   117/62  Pulse: 93 89  87  Resp: 20 20  20   Temp: 98.7 F (37.1 C) 98.9 F (37.2 C)  97.7 F (36.5 C)  TempSrc: Oral Oral  Oral  SpO2: 100% 96%  97%  Weight:   78.7 kg 78.7 kg  Height:        Intake/Output Summary (Last 24 hours) at 09/21/2017 0924 Last data filed at 09/21/2017 0334 Gross per 24 hour  Intake 139.63 ml  Output -  Net 139.63 ml   Filed Weights   09/20/17 0531 09/21/17 0500 09/21/17 0551  Weight: 77.1 kg 78.7 kg 78.7 kg    Telemetry    Normal sinus rhythm with occasional sinus tachycardia- Personally Reviewed  ECG    No new EKG to review- Personally Reviewed  Physical Exam   GEN: No acute distress.   Neck: No JVD Cardiac: RRR, no rubs, or gallops.  2/6 mid peaking systolic murmur the right upper sternal border rating to left lower sternal border Respiratory: Clear to auscultation bilaterally. GI: Soft, nontender, non-distended  MS: No edema; No deformity. Neuro:  Nonfocal  Psych: Normal affect   Labs    Chemistry Recent Labs  Lab 09/20/17 0612  09/21/17 0205  NA 140 141  K 3.1* 3.4*  CL 105 106  CO2 22 24  GLUCOSE 126* 99  BUN 24* 22  CREATININE 1.57* 1.51*  CALCIUM 9.0 8.6*  PROT 6.8  --   ALBUMIN 3.8  --   AST 25  --   ALT 15  --   ALKPHOS 99  --   BILITOT 0.8  --   GFRNONAA 28* 29*  GFRAA 32* 34*  ANIONGAP 13 11     Hematology Recent Labs  Lab 09/19/17 1153 09/20/17 0612 09/21/17 0205  WBC 6.7 9.2 5.3  RBC 4.00 4.35 3.71*  HGB 11.6 12.7 11.0*  HCT 36.2 39.6 33.2*  MCV 91 91.0 89.5  MCH 29.0 29.2 29.6  MCHC 32.0 32.1 33.1  RDW 14.8 14.1 14.2  PLT 210 178 153    Cardiac Enzymes Recent Labs  Lab 09/20/17 1606 09/20/17 2315 09/21/17 0205  TROPONINI 2.84* 2.53* 2.52*    Recent Labs  Lab 09/20/17 0729  TROPIPOC 0.54*     BNP Recent Labs  Lab 09/20/17 0726  BNP 905.4*     DDimer No results for input(s): DDIMER in the last 168 hours.   Radiology    Dg Chest Portable 1 View  Result Date: 09/20/2017 CLINICAL DATA:  Subacute onset of respiratory difficulties and diarrhea. Wheezing. EXAM: PORTABLE  CHEST 1 VIEW COMPARISON:  Chest radiograph performed 04/29/2011 FINDINGS: The lungs are well-aerated. Vascular congestion is noted. Bibasilar airspace opacities may reflect interstitial edema or possibly pneumonia. No significant pleural effusion or pneumothorax is seen. The cardiomediastinal silhouette is within normal limits. No acute osseous abnormalities are seen. Thoracolumbar spinal fusion hardware is partially imaged. IMPRESSION: Vascular congestion noted. Bibasilar airspace opacities may reflect interstitial edema or possibly pneumonia. Electronically Signed   By: Roanna RaiderJeffery  Chang M.D.   On: 09/20/2017 06:07    Cardiac Studies   2D echo  Study Conclusions  - Left ventricle: The cavity size was normal. Wall thickness was   increased in a pattern of mild LVH. The estimated ejection   fraction was 55%. Although no diagnostic regional wall motion   abnormality was identified, this possibility  cannot be completely   excluded on the basis of this study. Doppler parameters are   consistent with abnormal left ventricular relaxation (grade 1   diastolic dysfunction). - Aortic valve: Trileaflet; severely calcified leaflets. There was   moderate stenosis. Mean gradient (S): 29 mm Hg. Peak gradient   (S): 44 mm Hg. Valve area (VTI): 1.2 cm^2. - Mitral valve: Moderately calcified annulus. There was no evidence   for stenosis. There was mild regurgitation. Mean gradient (D): 3   mm Hg. Valve area by pressure half-time: 2.88 cm^2. - Left atrium: The atrium was severely dilated. - Right ventricle: The cavity size was normal. Systolic function   was normal. - Pulmonary arteries: No complete TR doppler jet so unable to   estimate PA systolic pressure. - Inferior vena cava: The vessel was normal in size. The   respirophasic diameter changes were in the normal range (>= 50%),   consistent with normal central venous pressure.  Impressions:  - Normal LV size with mild LV hypertrophy. EF 55%. Normal RV size   and systolic function. Moderate aortic stenosis. Mild mitral   regurgitation. LAE.   Patient Profile     84 y.o. female with a history of mild to moderate AS by last echo 2016 and normal LVF, mildly dilated ascending aorta.  She has not been seen in several years and about 6 weeks ago starting having problems with increased LE edema, DOE, and exertional throat pain.  She denies any actual CP but complains mainly of exertional throat pain. with a history of mild to moderate AS by last echo 2016 and normal LVF, mildly dilated ascending aorta.  She has not been seen in several years and about 6 weeks ago starting having problems with increased LE edema, DOE, and exertional throat pain.  She denies any actual CP but complains mainly of exertional throat pain.   Assessment & Plan    1. Acute diastolic CHF  -Likely related to combination of moderate aortic stenosis and possible underlying  coronary ischemia -EKG showed marked ST depression throughout the precordial and inferior leads.  Troponin also elevated at >2 -LV function normal on echo with only moderate aortic stenosis and therefore suspect this may be ischemia related -She responded well to Lasix 40 mg IV yesterday with resolution of her shortness of breath.  She was able to lie flat with no oxygen last night to sleep. -I&O's are incomplete and weight is unchanged -Creatinine slightly improved at 1.  At 1 (1.57>1.51) -She appears euvolemic on exam with clear lungs and no lower extremity edema -Start Lasix 20 mg p.o. Daily  2.  Hypokalemia -likely related to a combination of diuretic therapy and  recent diarrhea -Replete to keep potassium greater than 4  3.  NSTEMI -Troponin elevated at 0.54>2.84>2.53>2.52 -With flat trend this could be demand ischemia but suspect given her markedly ischemic looking EKG that she has underlying CAD -Would recommend right and left heart catheterization but will plan on this tomorrow since she does have some mild chronic kidney disease and got her ACE inhibitor yesterday -ACE inhibitor now on hold -Continue IV heparin drip, beta-blocker, Plavix 75 mg daily which she is on for history of CVA and aspirin 81 mg daily. -Cardiac catheterization was discussed with the patient fully. The patient understands that risks include but are not limited to stroke (1 in 1000), death (1 in 1000), kidney failure [usually temporary] (1 in 500), bleeding (1 in 200), allergic reaction [possibly serious] (1 in 200).  The patient understands and is willing to proceed.    4.  Moderate aortic stenosis -2D echothis admission showed moderate aortic stenosis with a mean aortic valve gradient of 26 mmHg, aortic valve area 1.2 cm and dimensionless index 0.27 all consistent with moderate aortic stenosis  5.  Diarrhea - appreciate IM w/u.  -clearly troublesome for patient and not really related to her acute cardiac  issues.  6.  Dilated ascending aorta  -2D echocardiogram did not show any dilatation but once renal function has improved consider chest CT angios for complete evaluation  7.  Essential HTN  -BP well controlled at 127/80 mmHg -Continue beta-blocker -ACE on hold due to acute on chronic kidney disease  8.  Hyperlipidemia  -LFTs normal so will restart statin  I have spent a total of 40 minutes with patient reviewing 2d echo , telemetry, EKGs, labs and examining patient as well as establishing an assessment and plan including cath procedure and risk/benifites of procedure that was discussed with the patient.  > 50% of time was spent in direct patient care.    For questions or updates, please contact [facility] Please consult [email or web address] for contact info under Cardiology/STEMI.      Signed, Armanda Magic, MD  09/21/2017, 9:24 AM

## 2017-09-21 NOTE — Progress Notes (Signed)
ANTICOAGULATION CONSULT NOTE - Follow Up Consult  Pharmacy Consult for Heparin Indication: chest pain/ACS  Allergies  Allergen Reactions  . Betadine [Povidone Iodine] Itching    Pt can tolerate shrimp and shellfish    Patient Measurements: Height: 5\' 3"  (160 cm) Weight: 173 lb 8 oz (78.7 kg) IBW/kg (Calculated) : 52.4 Heparin Dosing Weight:  69 kg  Vital Signs: Temp: 97.7 F (36.5 C) (08/08 0551) Temp Source: Oral (08/08 0551) BP: 127/80 (08/08 0942) Pulse Rate: 103 (08/08 0942)  Labs: Recent Labs    09/19/17 1153 09/20/17 0612 09/20/17 1606 09/20/17 2315 09/21/17 0205 09/21/17 0900  HGB 11.6 12.7  --   --  11.0*  --   HCT 36.2 39.6  --   --  33.2*  --   PLT 210 178  --   --  153  --   HEPARINUNFRC  --   --   --   --  0.22* 0.32  CREATININE  --  1.57*  --   --  1.51*  --   TROPONINI  --   --  2.84* 2.53* 2.52*  --     Estimated Creatinine Clearance: 24.1 mL/min (A) (by C-G formula based on SCr of 1.51 mg/dL (H)).   Assessment: Anticoag: r/o ischemia on IV heparin. HL 0.22>0.32. Hgb 12.7>11. Plts 210>153 watch  Goal of Therapy:  Heparin level 0.3-0.7 units/ml Monitor platelets by anticoagulation protocol: Yes   Plan:  Cath Con't IV heparin at 950 units/hr Daily HL and CBC   Amber Guthridge S. Merilynn Finlandobertson, PharmD, [facility]BCPS Clinical Staff Pharmacist Pager [phone]  Misty Stanleyobertson, Ariabella Brien Stillinger 09/21/2017,10:10 AM

## 2017-09-21 NOTE — Progress Notes (Signed)
PROGRESS NOTE  Suzanne Howard ZOX:[id number] DOB: 1924-09-29 DOA: 09/20/2017 PCP: Mila Palmer, MD  Brief Narrative: 84 year old woman PMH moderate aortic stenosis presented with shortness of breath and exertional chest pain.  Admitted for further evaluation.  Assessment/Plan NSTEMI --Asymptomatic.  Hemodynamic stable.  Cardiology plans right/left heart catheterization 8/9 --ACE inhibitor on hold for now, continue management per cardiology including heparin infusion, beta-blocker, Plavix, aspirin  Acute diastolic CHF, thought to be combination of moderate aortic stenosis, underlying coronary ischemia --Appears asymptomatic.  Continue Lasix per cardiology  Moderate aortic stenosis.  Further recommendations per cardiology.  Acute kidney injury versus CKD --Follow daily BMP.  Essential hypertension --Stable.  Continue beta-blocker.     DVT prophylaxis: heparin infusion Code Status: DNR Family Communication: 2 daughters at bedside Disposition Plan: home    Brendia Sacks, MD  [facility] Direct contact: [phone] --Via amion app OR  --[email or web address]; password [email or web address]  7PM-7AM contact night coverage as above 09/21/2017, 6:09 PM  LOS: 1 day   Consultants:  Cardiology   Procedures:  Echo Study Conclusions  - Left ventricle: The cavity size was normal. Wall thickness was   increased in a pattern of mild LVH. The estimated ejection   fraction was 55%. Although no diagnostic regional wall motion   abnormality was identified, this possibility cannot be completely   excluded on the basis of this study. Doppler parameters are   consistent with abnormal left ventricular relaxation (grade 1   diastolic dysfunction). - Aortic valve: Trileaflet; severely calcified leaflets. There was   moderate stenosis. Mean gradient (S): 29 mm Hg. Peak gradient   (S): 44 mm Hg. Valve area (VTI): 1.2 cm^2. - Mitral valve: Moderately calcified annulus. There was no evidence  for stenosis. There was mild regurgitation. Mean gradient (D): 3   mm Hg. Valve area by pressure half-time: 2.88 cm^2. - Left atrium: The atrium was severely dilated. - Right ventricle: The cavity size was normal. Systolic function   was normal. - Pulmonary arteries: No complete TR doppler jet so unable to   estimate PA systolic pressure. - Inferior vena cava: The vessel was normal in size. The   respirophasic diameter changes were in the normal range (>= 50%),   consistent with normal central venous pressure.  Impressions:  - Normal LV size with mild LV hypertrophy. EF 55%. Normal RV size   and systolic function. Moderate aortic stenosis. Mild mitral   regurgitation. LAE.  Antimicrobials:    Interval history/Subjective: Feels fine, no complaints.  Objective: Vitals:  Vitals:   09/21/17 1148 09/21/17 1650  BP: 114/73 134/77  Pulse: 84 92  Resp: 18 18  Temp: 98.3 F (36.8 C) 97.9 F (36.6 C)  SpO2: 97% 98%    Exam:  Constitutional:  . Appears calm and comfortable, appears younger than age Respiratory:  . CTA bilaterally, no w/r/r.  . Respiratory effort normal. Cardiovascular:  . RRR, 2/6 systolic murmur; no r/g . No LE extremity edema   Psychiatric:  . Mental status o Mood, affect appropriate  I have personally reviewed the following:   Labs:  Potassium 3.4, creatinine 1.51  Troponin II 0.52  CBC unremarkable  Scheduled Meds: . ALPRAZolam  0.25 mg Oral QHS  . aspirin EC  81 mg Oral Daily  . clopidogrel  75 mg Oral Daily  . furosemide  20 mg Oral Daily  . ipratropium  2 spray Each Nare QID  . metoprolol succinate  50 mg Oral Daily  . potassium chloride  20 mEq Oral  Daily  . simvastatin  20 mg Oral q1800  . sodium chloride flush  3 mL Intravenous Q12H   Continuous Infusions: . sodium chloride    . heparin 950 Units/hr (09/21/17 1435)    Principal Problem:   NSTEMI (non-ST elevated myocardial infarction) [facility](HCC) Active Problems:   Aortic  stenosis   Essential hypertension   Acute on chronic diastolic (congestive) heart failure (HCC)   Acute kidney injury (HCC)   Hypertension   CKD (chronic kidney disease)   Pressure injury of skin   LOS: 1 day

## 2017-09-22 ENCOUNTER — Encounter (HOSPITAL_COMMUNITY)
Admission: EM | Disposition: A | Discharge status: Home / Self Care | Payer: Self-pay | Source: Home / Self Care | Attending: Family Medicine

## 2017-09-22 DIAGNOSIS — I2510 Atherosclerotic heart disease of native coronary artery without angina pectoris: Secondary | ICD-10-CM

## 2017-09-22 DIAGNOSIS — N183 Chronic kidney disease, stage 3 (moderate): Secondary | ICD-10-CM

## 2017-09-22 HISTORY — PX: RIGHT/LEFT HEART CATH AND CORONARY ANGIOGRAPHY: CATH118266

## 2017-09-22 LAB — CBC
HCT: 33.7 % — ABNORMAL LOW (ref 36.0–46.0)
HEMOGLOBIN: 11 g/dL — AB (ref 12.0–15.0)
MCH: 29.4 pg (ref 26.0–34.0)
MCHC: 32.6 g/dL (ref 30.0–36.0)
MCV: 90.1 fL (ref 78.0–100.0)
PLATELETS: 157 10*3/uL (ref 150–400)
RBC: 3.74 MIL/uL — ABNORMAL LOW (ref 3.87–5.11)
RDW: 14.1 % (ref 11.5–15.5)
WBC: 5.7 10*3/uL (ref 4.0–10.5)

## 2017-09-22 LAB — BASIC METABOLIC PANEL
Anion gap: 12 (ref 5–15)
BUN: 26 mg/dL — AB (ref 8–23)
CALCIUM: 8.8 mg/dL — AB (ref 8.9–10.3)
CHLORIDE: 104 mmol/L (ref 98–111)
CO2: 24 mmol/L (ref 22–32)
Creatinine, Ser: 1.55 mg/dL — ABNORMAL HIGH (ref 0.44–1.00)
GFR calc Af Amer: 33 mL/min — ABNORMAL LOW (ref >60)
GFR, EST NON AFRICAN AMERICAN: 28 mL/min — AB (ref >60)
Glucose, Bld: 96 mg/dL (ref 70–99)
Potassium: 3.8 mmol/L (ref 3.5–5.1)
SODIUM: 140 mmol/L (ref 135–145)

## 2017-09-22 LAB — POCT I-STAT 3, ART BLOOD GAS (G3+)
Acid-base deficit: 2 mmol/L (ref 0.0–2.0)
Bicarbonate: 21 mmol/L (ref 20.0–28.0)
O2 Saturation: 98 %
PH ART: 7.459 — AB (ref 7.350–7.450)
TCO2: 22 mmol/L (ref 22–32)
pCO2 arterial: 29.7 mmHg — ABNORMAL LOW (ref 32.0–48.0)
pO2, Arterial: 104 mmHg (ref 83.0–108.0)

## 2017-09-22 LAB — POCT I-STAT 3, VENOUS BLOOD GAS (G3P V)
ACID-BASE DEFICIT: 2 mmol/L (ref 0.0–2.0)
Bicarbonate: 22.7 mmol/L (ref 20.0–28.0)
Bicarbonate: 24.2 mmol/L (ref 20.0–28.0)
O2 SAT: 67 %
O2 Saturation: 70 %
PCO2 VEN: 36.4 mmHg — AB (ref 44.0–60.0)
PO2 VEN: 36 mmHg (ref 32.0–45.0)
TCO2: 24 mmol/L (ref 22–32)
TCO2: 25 mmol/L (ref 22–32)
pCO2, Ven: 37.3 mmHg — ABNORMAL LOW (ref 44.0–60.0)
pH, Ven: 7.403 (ref 7.250–7.430)
pH, Ven: 7.421 (ref 7.250–7.430)
pO2, Ven: 35 mmHg (ref 32.0–45.0)

## 2017-09-22 LAB — HEPARIN LEVEL (UNFRACTIONATED): HEPARIN UNFRACTIONATED: 0.29 [IU]/mL — AB (ref 0.30–0.70)

## 2017-09-22 SURGERY — RIGHT/LEFT HEART CATH AND CORONARY ANGIOGRAPHY
Anesthesia: LOCAL

## 2017-09-22 MED ORDER — HEPARIN SODIUM (PORCINE) 1000 UNIT/ML IJ SOLN
INTRAMUSCULAR | Status: AC
  Filled 2017-09-22: qty 1

## 2017-09-22 MED ORDER — RANOLAZINE ER 500 MG PO TB12
500.0000 mg | ORAL_TABLET | Freq: Two times a day (BID) | ORAL | Status: DC
  Administered 2017-09-22 – 2017-09-23 (×3): 500 mg via ORAL
  Filled 2017-09-22 (×3): qty 1

## 2017-09-22 MED ORDER — SODIUM CHLORIDE 0.9% FLUSH
3.0000 mL | Freq: Two times a day (BID) | INTRAVENOUS | Status: DC
  Administered 2017-09-23: 3 mL via INTRAVENOUS

## 2017-09-22 MED ORDER — HEPARIN SODIUM (PORCINE) 1000 UNIT/ML IJ SOLN
INTRAMUSCULAR | Status: DC | PRN
  Administered 2017-09-22: 4000 [IU] via INTRAVENOUS

## 2017-09-22 MED ORDER — LIDOCAINE HCL (PF) 1 % IJ SOLN
INTRAMUSCULAR | Status: DC | PRN
  Administered 2017-09-22 (×2): 2 mL

## 2017-09-22 MED ORDER — HEPARIN (PORCINE) IN NACL 100-0.45 UNIT/ML-% IJ SOLN
1150.0000 [IU]/h | INTRAMUSCULAR | Status: DC
  Administered 2017-09-22: 1050 [IU]/h via INTRAVENOUS
  Filled 2017-09-22: qty 250

## 2017-09-22 MED ORDER — VERAPAMIL HCL 2.5 MG/ML IV SOLN
INTRAVENOUS | Status: AC
  Filled 2017-09-22: qty 2

## 2017-09-22 MED ORDER — SODIUM CHLORIDE 0.9% FLUSH: 3.0000 mL | INTRAVENOUS | Status: DC | PRN

## 2017-09-22 MED ORDER — HEPARIN (PORCINE) IN NACL 1000-0.9 UT/500ML-% IV SOLN
INTRAVENOUS | Status: DC | PRN
  Administered 2017-09-22 (×2): 500 mL

## 2017-09-22 MED ORDER — ONDANSETRON HCL 4 MG/2ML IJ SOLN: 4.0000 mg | Freq: Four times a day (QID) | INTRAMUSCULAR | Status: DC | PRN

## 2017-09-22 MED ORDER — SODIUM CHLORIDE 0.9 % IV SOLN: INTRAVENOUS | Status: DC

## 2017-09-22 MED ORDER — HEPARIN (PORCINE) IN NACL 1000-0.9 UT/500ML-% IV SOLN
INTRAVENOUS | Status: AC
  Filled 2017-09-22: qty 1000

## 2017-09-22 MED ORDER — SODIUM CHLORIDE 0.9 % IV SOLN: INTRAVENOUS | Status: AC

## 2017-09-22 MED ORDER — IOPAMIDOL (ISOVUE-370) INJECTION 76%
INTRAVENOUS | Status: AC
  Filled 2017-09-22: qty 100

## 2017-09-22 MED ORDER — ASPIRIN 81 MG PO CHEW
81.0000 mg | CHEWABLE_TABLET | ORAL | Status: AC
  Administered 2017-09-22: 81 mg via ORAL
  Filled 2017-09-22: qty 1

## 2017-09-22 MED ORDER — SODIUM CHLORIDE 0.9% FLUSH: 3.0000 mL | Freq: Two times a day (BID) | INTRAVENOUS | Status: DC

## 2017-09-22 MED ORDER — ACETAMINOPHEN 325 MG PO TABS: 650.0000 mg | ORAL_TABLET | ORAL | Status: DC | PRN

## 2017-09-22 MED ORDER — SODIUM CHLORIDE 0.9 % IV SOLN: 250.0000 mL | INTRAVENOUS | Status: DC | PRN

## 2017-09-22 MED ORDER — VERAPAMIL HCL 2.5 MG/ML IV SOLN
INTRAVENOUS | Status: DC | PRN
  Administered 2017-09-22: 10 mL via INTRA_ARTERIAL

## 2017-09-22 MED ORDER — IOPAMIDOL (ISOVUE-370) INJECTION 76%
INTRAVENOUS | Status: DC | PRN
  Administered 2017-09-22: 25 mL via INTRA_ARTERIAL

## 2017-09-22 MED ORDER — LIDOCAINE HCL (PF) 1 % IJ SOLN
INTRAMUSCULAR | Status: AC
  Filled 2017-09-22: qty 30

## 2017-09-22 SURGICAL SUPPLY — 16 items
CATH BALLN WEDGE 5F 110CM (CATHETERS) ×1 IMPLANT
CATH INFINITI 5 FR MPA2 (CATHETERS) ×1 IMPLANT
CATH INFINITI 5FR MULTPACK ANG (CATHETERS) ×1 IMPLANT
CATH LANGSTON DUAL LUM PIG 6FR (CATHETERS) ×1 IMPLANT
DEVICE RAD COMP TR BAND LRG (VASCULAR PRODUCTS) ×1 IMPLANT
ELECT DEFIB PAD ADLT CADENCE (PAD) ×1 IMPLANT
GLIDESHEATH SLEND SS 6F .021 (SHEATH) ×1 IMPLANT
GUIDEWIRE INQWIRE 1.5J.035X260 (WIRE) IMPLANT
INQWIRE 1.5J .035X260CM (WIRE) ×2
KIT HEART LEFT (KITS) ×2 IMPLANT
PACK CARDIAC CATHETERIZATION (CUSTOM PROCEDURE TRAY) ×2 IMPLANT
SHEATH GLIDE SLENDER 4/5FR (SHEATH) ×1 IMPLANT
TRANSDUCER W/STOPCOCK (MISCELLANEOUS) ×3 IMPLANT
TUBING CIL FLEX 10 FLL-RA (TUBING) ×2 IMPLANT
WIRE EMERALD ST .035X150CM (WIRE) ×1 IMPLANT
WIRE HI TORQ VERSACORE-J 145CM (WIRE) ×1 IMPLANT

## 2017-09-22 NOTE — Care Management Note (Signed)
Case Management Note  Patient Details  Name: Netta CorriganRebecca S Livecchi MRN: [id number] Date of Birth: 05/31/1924  Subjective/Objective:       NSTEMI            Action/Plan: [facility]; has [facility] with [facility] [employer] with prescription drug coverage; for cardiac cath today. CM will continue to follow for progression of care.  Expected Discharge Date:     Possibly 09/25/2017             Expected Discharge Plan:  Home/Self Care  Discharge planning Services  CM Consult  Status of Service:  In process, will continue to follow  Reola MosherChandler, Reniya Mcclees L, RN,MHA,BSN [phone] 09/22/2017, 12:54 PM

## 2017-09-22 NOTE — Progress Notes (Signed)
PROGRESS NOTE  Suzanne Howard ZOX:[id number] DOB: [birth year] DOA: 09/20/2017 PCP: Mila Palmer, MD  Brief Narrative: 84 year old woman PMH moderate aortic stenosis presented with shortness of breath and exertional chest pain.  Admitted for further evaluation.  Assessment/Plan NSTEMI --Remains asymptomatic.  Catheterization showed severe three-vessel disease.  Await further cardiology recommendations.  For now continue heparin infusion, beta-blocker, Plavix, aspirin.    Acute diastolic CHF, thought to be combination of moderate aortic stenosis, underlying coronary ischemia --Asymptomatic.  Appears euvolemic.  Consider restarting Lasix tomorrow.  Moderate aortic stenosis.  Further management per cardiology.  Acute kidney injury versus CKD --Renal function remains stable.  Post-cath hydration today per cardiology, check BMP in the morning.  Essential hypertension --Remains stable.  Continue beta-blocker.     DVT prophylaxis: heparin infusion Code Status: DNR Family Communication:  Disposition Plan: home    Brendia Sacks, MD  [facility] Direct contact: [phone] --Via amion app OR  --[email or web address]; password [email or web address]  7PM-7AM contact night coverage as above 09/22/2017, 12:21 PM  LOS: 2 days   Consultants:  Cardiology   Procedures:  [facility] FINDINGS: 1. Severe 3-vessel CAD, with heavy calcification of the coronaries. 2. Diffusely calcified ascending aorta. 3. Moderate aortic stenosis. 4. Upper normal to mildly elevated left and right heart filling pressures. 5. Normal Fick cardiac output/index.  RECOMMENDATIONS: 1. Gentle post-cath hydration today with holding of furosemide today.  Restart furosemide tomorrow, as renal function allows. 2. Continue metoprolol; start ranolazine 500 mg BID. No percutaneous revascularization options.  Cardiac surgery consultation for CABG/AVR could be considered, though given the patient's age and comorbities, she would be  high risk for surgical intervention.  Medical therapy may be her best option.  Echo Study Conclusions  - Left ventricle: The cavity size was normal. Wall thickness was   increased in a pattern of mild LVH. The estimated ejection   fraction was 55%. Although no diagnostic regional wall motion   abnormality was identified, this possibility cannot be completely   excluded on the basis of this study. Doppler parameters are   consistent with abnormal left ventricular relaxation (grade 1   diastolic dysfunction). - Aortic valve: Trileaflet; severely calcified leaflets. There was   moderate stenosis. Mean gradient (S): 29 mm Hg. Peak gradient   (S): 44 mm Hg. Valve area (VTI): 1.2 cm^2. - Mitral valve: Moderately calcified annulus. There was no evidence   for stenosis. There was mild regurgitation. Mean gradient (D): 3   mm Hg. Valve area by pressure half-time: 2.88 cm^2. - Left atrium: The atrium was severely dilated. - Right ventricle: The cavity size was normal. Systolic function   was normal. - Pulmonary arteries: No complete TR doppler jet so unable to   estimate PA systolic pressure. - Inferior vena cava: The vessel was normal in size. The   respirophasic diameter changes were in the normal range (>= 50%),   consistent with normal central venous pressure.  Impressions:  - Normal LV size with mild LV hypertrophy. EF 55%. Normal RV size   and systolic function. Moderate aortic stenosis. Mild mitral   regurgitation. LAE.  Antimicrobials:    Interval history/Subjective: Feels well, no complaints.  Objective: Vitals:  Vitals:   09/22/17 1100 09/22/17 1110  BP: (!) 128/56 (!) 116/92  Pulse: 77 83  Resp: 20 20  Temp: (!) 97.5 F (36.4 C)   SpO2: 96% 98%    Exam: Constitutional:   . Appears calm and comfortable Respiratory:  . CTA bilaterally, no w/r/r.  . Respiratory  effort normal. No retractions or accessory muscle use Cardiovascular:  . RRR, no m/r/g . No LE  extremity edema   Moves both legs to command. Psychiatric:  . Mental status o Mood, affect appropriate  I have personally reviewed the following:   Labs:  BMP notable for BUN 26, creatinine 1.55, no significant change.  CBC unremarkable.  Scheduled Meds: . ALPRAZolam  0.25 mg Oral QHS  . aspirin EC  81 mg Oral Daily  . ipratropium  2 spray Each Nare QID  . metoprolol succinate  50 mg Oral Daily  . potassium chloride  20 mEq Oral Daily  . ranolazine  500 mg Oral BID  . simvastatin  20 mg Oral q1800  . sodium chloride flush  3 mL Intravenous Q12H  . sodium chloride flush  3 mL Intravenous Q12H   Continuous Infusions: . sodium chloride    . sodium chloride    . sodium chloride    . heparin      Principal Problem:   NSTEMI (non-ST elevated myocardial infarction) [facility](HCC) Active Problems:   Aortic stenosis   Essential hypertension   Acute on chronic diastolic (congestive) heart failure (HCC)   Acute kidney injury (HCC)   Hypertension   CKD (chronic kidney disease)   Pressure injury of skin   LOS: 2 days

## 2017-09-22 NOTE — Interval H&P Note (Signed)
History and Physical Interval Note:  09/22/2017 9:22 AM  Suzanne Howard  has presented today for cardiac catheterization, with the diagnosis of NSTEMI and acute heart failure. The various methods of treatment have been discussed with the patient and family. After consideration of risks, benefits and other options for treatment, the patient has consented to  Left and right heart catheterization with possible interventino as a surgical intervention .  The patient's history has been reviewed, patient examined, no change in status, stable for surgery.  I have reviewed the patient's chart and labs.  Questions were answered to the patient's satisfaction.  We have discussed her code status.  She has agreed to rescind her DNR for the duration of the procedure.  Cath Lab Visit (complete for each Cath Lab visit)  Clinical Evaluation Leading to the Procedure:   ACS: Yes.    Non-ACS:  N/A  Zvi Duplantis

## 2017-09-22 NOTE — Progress Notes (Signed)
ANTICOAGULATION CONSULT NOTE - Follow Up Consult  Pharmacy Consult for Heparin Indication: chest pain/ACS  Allergies  Allergen Reactions  . Betadine [Povidone Iodine] Itching    Pt can tolerate shrimp and shellfish    Patient Measurements: Height: 5\' 3"  (160 cm) Weight: 172 lb (78 kg)(scale c) IBW/kg (Calculated) : 52.4 Heparin Dosing Weight:  69 kg  Vital Signs: Temp: 98.3 F (36.8 C) (08/09 0600) Temp Source: Oral (08/09 0600) BP: 143/79 (08/09 0600) Pulse Rate: 78 (08/09 0600)  Labs: Recent Labs    09/20/17 0612 09/20/17 1606 09/20/17 2315 09/21/17 0205 09/21/17 0900 09/22/17 0544  HGB 12.7  --   --  11.0*  --  11.0*  HCT 39.6  --   --  33.2*  --  33.7*  PLT 178  --   --  153  --  157  HEPARINUNFRC  --   --   --  0.22* 0.32 0.29*  CREATININE 1.57*  --   --  1.51*  --  1.55*  TROPONINI  --  2.84* 2.53* 2.52*  --   --     Estimated Creatinine Clearance: 23.4 mL/min (A) (by C-G formula based on SCr of 1.55 mg/dL (H)).   Assessment: 84 year old female continues on heparin for chest pain Heparin level = 0.29 Cath planned for today  Goal of Therapy:  Heparin level 0.3-0.7 units/ml Monitor platelets by anticoagulation protocol: Yes   Plan:  Increase heparin to 1000 units / hr Follow up after cath  Thank you Okey RegalLisa Nyzir Dubois, PharmD [phone] 09/22/2017,8:31 AM

## 2017-09-22 NOTE — Brief Op Note (Signed)
BRIEF CARDIAC CATHETERIZATION NOTE  DATE: 09/22/2017 TIME: 10:50 AM  PATIENT:  Suzanne Howard  84 y.o. female  PRE-OPERATIVE DIAGNOSIS:  NSTEMI, heart failure, aortic stenosis  POST-OPERATIVE DIAGNOSIS:  Same  PROCEDURE:  Procedure(s): RIGHT/LEFT HEART CATH AND CORONARY ANGIOGRAPHY (N/A)  SURGEON:  Surgeon(s) and Role:    * Miranda Frese, Cristal Deerhristopher, MD - Primary  FINDINGS: 1. Severe 3-vessel CAD, with heavy calcification of the coronaries. 2. Diffusely calcified ascending aorta. 3. Moderate aortic stenosis. 4. Upper normal to mildly elevated left and right heart filling pressures. 5. Normal Fick cardiac output/index.  RECOMMENDATIONS: 1. Gentle post-cath hydration today with holding of furosemide today.  Restart furosemide tomorrow, as renal function allows. 2. Continue metoprolol; start ranolazine 500 mg BID. 3. No percutaneous revascularization options.  Cardiac surgery consultation for CABG/AVR could be considered, though given the patient's age and comorbities, she would be high risk for surgical intervention.  Medical therapy may be her best option.  Yvonne Kendallhristopher Dora Clauss, MD [facility] Pager: [phone]

## 2017-09-22 NOTE — Progress Notes (Signed)
ANTICOAGULATION CONSULT NOTE - Follow Up Consult  Pharmacy Consult for Heparin Indication: chest pain/ACS  Allergies  Allergen Reactions  . Betadine [Povidone Iodine] Itching    Pt can tolerate shrimp and shellfish    Patient Measurements: Height: 5\' 3"  (160 cm) Weight: 172 lb (78 kg)(scale c) IBW/kg (Calculated) : 52.4 Heparin Dosing Weight:  69 kg  Vital Signs: Temp: 97.5 F (36.4 C) (08/09 1100) Temp Source: Oral (08/09 1100) BP: 116/92 (08/09 1110) Pulse Rate: 83 (08/09 1110)  Labs: Recent Labs    09/20/17 0612 09/20/17 1606 09/20/17 2315 09/21/17 0205 09/21/17 0900 09/22/17 0544  HGB 12.7  --   --  11.0*  --  11.0*  HCT 39.6  --   --  33.2*  --  33.7*  PLT 178  --   --  153  --  157  HEPARINUNFRC  --   --   --  0.22* 0.32 0.29*  CREATININE 1.57*  --   --  1.51*  --  1.55*  TROPONINI  --  2.84* 2.53* 2.52*  --   --     Estimated Creatinine Clearance: 23.4 mL/min (A) (by C-G formula based on SCr of 1.55 mg/dL (H)).   Assessment: 84 year old female continues on heparin for chest pain S/p cath with severe 3 vessel disease, heparin to resume pending CVTS consult  Goal of Therapy:  Heparin level 0.3-0.7 units/ml Monitor platelets by anticoagulation protocol: Yes   Plan:  Heparin at 1050 units / hr starting at 5 pm 8 hour heparin level  Thank you Okey RegalLisa Gracemarie Skeet, PharmD [phone] 09/22/2017,11:35 AM

## 2017-09-23 ENCOUNTER — Other Ambulatory Visit: Payer: Self-pay | Admitting: Physician Assistant

## 2017-09-23 DIAGNOSIS — N289 Disorder of kidney and ureter, unspecified: Secondary | ICD-10-CM

## 2017-09-23 LAB — CBC
HEMATOCRIT: 33.4 % — AB (ref 36.0–46.0)
Hemoglobin: 10.9 g/dL — ABNORMAL LOW (ref 12.0–15.0)
MCH: 29.6 pg (ref 26.0–34.0)
MCHC: 32.6 g/dL (ref 30.0–36.0)
MCV: 90.8 fL (ref 78.0–100.0)
Platelets: 157 10*3/uL (ref 150–400)
RBC: 3.68 MIL/uL — ABNORMAL LOW (ref 3.87–5.11)
RDW: 14.4 % (ref 11.5–15.5)
WBC: 5.3 10*3/uL (ref 4.0–10.5)

## 2017-09-23 LAB — BASIC METABOLIC PANEL
Anion gap: 10 (ref 5–15)
BUN: 21 mg/dL (ref 8–23)
CHLORIDE: 106 mmol/L (ref 98–111)
CO2: 24 mmol/L (ref 22–32)
Calcium: 8.9 mg/dL (ref 8.9–10.3)
Creatinine, Ser: 1.62 mg/dL — ABNORMAL HIGH (ref 0.44–1.00)
GFR calc non Af Amer: 27 mL/min — ABNORMAL LOW (ref >60)
GFR, EST AFRICAN AMERICAN: 31 mL/min — AB (ref >60)
Glucose, Bld: 103 mg/dL — ABNORMAL HIGH (ref 70–99)
POTASSIUM: 4.2 mmol/L (ref 3.5–5.1)
Sodium: 140 mmol/L (ref 135–145)

## 2017-09-23 LAB — HEPARIN LEVEL (UNFRACTIONATED): HEPARIN UNFRACTIONATED: 0.29 [IU]/mL — AB (ref 0.30–0.70)

## 2017-09-23 MED ORDER — POTASSIUM CHLORIDE CRYS ER 20 MEQ PO TBCR: 20.0000 meq | EXTENDED_RELEASE_TABLET | Freq: Every day | ORAL | 0 refills | Status: DC

## 2017-09-23 MED ORDER — FUROSEMIDE 20 MG PO TABS: 20.0000 mg | ORAL_TABLET | Freq: Every day | ORAL | 0 refills | Status: DC

## 2017-09-23 MED ORDER — RANOLAZINE ER 500 MG PO TB12: 500.0000 mg | ORAL_TABLET | Freq: Two times a day (BID) | ORAL | 0 refills | Status: AC

## 2017-09-23 MED ORDER — SIMVASTATIN 40 MG PO TABS: 20.0000 mg | ORAL_TABLET | Freq: Every day | ORAL | 0 refills | Status: DC

## 2017-09-23 MED ORDER — ASPIRIN 81 MG PO TBEC: 81.0000 mg | DELAYED_RELEASE_TABLET | Freq: Every day | ORAL | Status: AC

## 2017-09-23 MED ORDER — SIMVASTATIN 40 MG PO TABS: 40.0000 mg | ORAL_TABLET | Freq: Every day | ORAL | 0 refills | Status: AC

## 2017-09-23 NOTE — Progress Notes (Signed)
Progress Note  Patient Name: Suzanne Howard Date of Encounter: 09/23/2017  Primary Cardiologist: Olga Millers, MD   Subjective   She denies any chest pain or shortness of breath.  Telemetry shows normal sinus rhythm with occasional sinus tachycardia.  Cardiac cath yesterday showed severe e vessel ASCAD with heavily calcified arteries and moderate AS>  Inpatient Medications    Scheduled Meds: . ALPRAZolam  0.25 mg Oral QHS  . aspirin EC  81 mg Oral Daily  . ipratropium  2 spray Each Nare QID  . metoprolol succinate  50 mg Oral Daily  . potassium chloride  20 mEq Oral Daily  . ranolazine  500 mg Oral BID  . simvastatin  20 mg Oral q1800  . sodium chloride flush  3 mL Intravenous Q12H  . sodium chloride flush  3 mL Intravenous Q12H   Continuous Infusions: . sodium chloride    . sodium chloride    . heparin 1,150 Units/hr (09/23/17 0428)   PRN Meds: sodium chloride, sodium chloride, acetaminophen, dextromethorphan-guaiFENesin, hydrALAZINE, ondansetron **OR** ondansetron (ZOFRAN) IV, sodium chloride flush, sodium chloride flush, traZODone   Vital Signs    Vitals:   09/23/17 0410 09/23/17 0816 09/23/17 0818 09/23/17 1225  BP: 131/78 (!) 129/111 (!) 141/75 132/70  Pulse: 85 78 77 69  Resp: 17 18  18   Temp: 98.6 F (37 C) 98.1 F (36.7 C)  98.1 F (36.7 C)  TempSrc: Oral Oral  Oral  SpO2: 98% 97% 97% 97%  Weight: 78.3 kg     Height:        Intake/Output Summary (Last 24 hours) at 09/23/2017 1318 Last data filed at 09/23/2017 0400 Gross per 24 hour  Intake 594.83 ml  Output 300 ml  Net 294.83 ml   Filed Weights   09/21/17 0551 09/22/17 0600 09/23/17 0410  Weight: 78.7 kg 78 kg 78.3 kg    Telemetry    NSR- Personally Reviewed  ECG    No new EKG to review- Personally Reviewed  Physical Exam   GEN: No acute distress.   Neck: No JVD Cardiac: RRR, no rubs, or gallops.  2/6 mid peaking systolic murmur the right upper sternal border radiating to left  lower sternal border Respiratory: Clear to auscultation bilaterally. GI: Soft, nontender, non-distended  MS: No edema; No deformity. Neuro:  Nonfocal  Psych: Normal affect   Labs    Chemistry Recent Labs  Lab 09/20/17 0612 09/21/17 0205 09/22/17 0544 09/23/17 0817  NA 140 141 140 140  K 3.1* 3.4* 3.8 4.2  CL 105 106 104 106  CO2 22 24 24 24   GLUCOSE 126* 99 96 103*  BUN 24* 22 26* 21  CREATININE 1.57* 1.51* 1.55* 1.62*  CALCIUM 9.0 8.6* 8.8* 8.9  PROT 6.8  --   --   --   ALBUMIN 3.8  --   --   --   AST 25  --   --   --   ALT 15  --   --   --   ALKPHOS 99  --   --   --   BILITOT 0.8  --   --   --   GFRNONAA 28* 29* 28* 27*  GFRAA 32* 34* 33* 31*  ANIONGAP 13 11 12 10      Hematology Recent Labs  Lab 09/21/17 0205 09/22/17 0544 09/23/17 0233  WBC 5.3 5.7 5.3  RBC 3.71* 3.74* 3.68*  HGB 11.0* 11.0* 10.9*  HCT 33.2* 33.7* 33.4*  MCV 89.5 90.1  90.8  MCH 29.6 29.4 29.6  MCHC 33.1 32.6 32.6  RDW 14.2 14.1 14.4  PLT 153 157 157    Cardiac Enzymes Recent Labs  Lab 09/20/17 1606 09/20/17 2315 09/21/17 0205  TROPONINI 2.84* 2.53* 2.52*    Recent Labs  Lab 09/20/17 0729  TROPIPOC 0.54*     BNP Recent Labs  Lab 09/20/17 0726  BNP 905.4*     DDimer No results for input(s): DDIMER in the last 168 hours.   Radiology    No results found.  Cardiac Studies   2D echo  Study Conclusions  - Left ventricle: The cavity size was normal. Wall thickness was   increased in a pattern of mild LVH. The estimated ejection   fraction was 55%. Although no diagnostic regional wall motion   abnormality was identified, this possibility cannot be completely   excluded on the basis of this study. Doppler parameters are   consistent with abnormal left ventricular relaxation (grade 1   diastolic dysfunction). - Aortic valve: Trileaflet; severely calcified leaflets. There was   moderate stenosis. Mean gradient (S): 29 mm Hg. Peak gradient   (S): 44 mm Hg. Valve area  (VTI): 1.2 cm^2. - Mitral valve: Moderately calcified annulus. There was no evidence   for stenosis. There was mild regurgitation. Mean gradient (D): 3   mm Hg. Valve area by pressure half-time: 2.88 cm^2. - Left atrium: The atrium was severely dilated. - Right ventricle: The cavity size was normal. Systolic function   was normal. - Pulmonary arteries: No complete TR doppler jet so unable to   estimate PA systolic pressure. - Inferior vena cava: The vessel was normal in size. The   respirophasic diameter changes were in the normal range (>= 50%),   consistent with normal central venous pressure.  Impressions:  - Normal LV size with mild LV hypertrophy. EF 55%. Normal RV size   and systolic function. Moderate aortic stenosis. Mild mitral   regurgitation. LAE.  Cardiac Cath 09/22/2017 Conclusions: 1. Severe 3-vessel CAD, as detailed below, with heavy calcification of the coronaries. 2. Diffusely calcified ascending aorta. 3. Moderate aortic stenosis. 4. Mildly elevated left and right heart filling pressures. 5. Normal Fick cardiac output/index.  Recommendations 1. Gentle post-cath hydration today with holding of furosemide today. Restart furosemide tomorrow, as renal function allows. 2. Continue metoprolol; start ranolazine 500 mg BID. 3. There are no feasable percutaneous revascularization options. Cardiac surgery consultation for CABG/AVR could be considered, though given the patient's age and comorbities, she would be high risk for surgical intervention. Medical therapy may be her best option.  I will hold clopidogrel pending CT surgery consultation.  If surgery is not pursued, clopidogrel should be restarted.  If patient does not undergo cardiac surgery: Recommend uninterrupted dual antiplatelet therapy with Aspirin 81mg  daily and Clopidogrel 75mg  daily for a minimum of 12 months (ACS - Class I recommendation).    Patient Profile     84 y.o. female with a history of mild  to moderate AS by last echo 2016 and normal LVF, mildly dilated ascending aorta.  She has not been seen in several years and about 6 weeks ago starting having problems with increased LE edema, DOE, and exertional throat pain.  She denies any actual CP but complains mainly of exertional throat pain. with a history of mild to moderate AS by last echo 2016 and normal LVF, mildly dilated ascending aorta.  She has not been seen in several years and about 6 weeks ago starting  having problems with increased LE edema, DOE, and exertional throat pain.  She denies any actual CP but complains mainly of exertional throat pain.   Assessment & Plan    1.  Acute diastolic CHF  -Likely related to combination of moderate aortic stenosis and possible underlying coronary ischemia -EKG showed marked ST depression throughout the precordial and inferior leads.  Troponin also elevated at >2 -LV function normal on echo with only moderate aortic stenosis and therefore suspect this may be ischemia related -She responded well to Lasix 40 mg IV yesterday with resolution of her shortness of breath.  She is able to lie flat with no oxygen last night to sleep. -I&O's are incomplete and weight is unchanged -Creatinine slightly improved at 1.62  (1.57>1.51>1.62) -She appears euvolemic on exam with clear lungs and no lower extremity edema -creatinine bumped post cath to  -LVEDP is elevated at 22mmHg -restart Lasix 20mg  PO daily and repeat BMET on Monday -Continue ASA 81mg  daily, Plavix 75mg  daily, Toprol XL 50mg  daily, Ranexa 500mg  bid and statin.  -increase simvastatin to 40mg  daily and repeat FLP and ALT in 6 weeks -would avoid Imdur with AS  2.  Hypokalemia -likely related to a combination of diuretic therapy and recent diarrhea -Repleted  3.  NSTEMI -Troponin elevated at 0.54>2.84>2.53>2.52 -cath with severe 3v ASCAD - patient not interested in CABG and with advanced age and co morbidities recommend medical  managemet -Continue beta-blocker, Plavix 75 mg daily which she is on for history of CVA and aspirin 81 mg daily. -Ranexa 500mg  BID added for angina -avoid nitrates with AS -increase Simvastatin to 40mg  daily  4.  Moderate aortic stenosis -2D echo this admission showed moderate aortic stenosis with a mean aortic valve gradient of 26 mmHg, aortic valve area 1.2 cm and dimensionless index 0.27 all consistent with moderate aortic stenosis.  Cath confirmed this.  5.  Diarrhea - appreciate IM w/u.  -clearly troublesome for patient and not really related to her acute cardiac issues.  6.  Dilated ascending aorta  -2D echocardiogram did not show any dilatation but once renal function has improved consider chest CT angios for complete evaluation as outpt once renal function improves  7.  Essential HTN  -BP well controlled at 132/9970mmHg -Continue beta-blocker -ACE on hold due to acute on chronic kidney disease - restart outpt if creatinine normalizes  8.  Hyperlipidemia  -LFTs normal so will restart statin and increase to Simvastatin 40mg  daily -repeat FLP and ALT    [facility] will sign off.   Medication Recommendations:  ASA 81mg  daily, Toprol XL 50mg  daily, Ranexa 500mg  BID, Simvastatin 40mg  daily, Plavix 75mg  daily, lasix 20mg  daily.  Other recommendations (labs, testing, etc):  BMET on Monday, FLP and ALT in 6 weeks Follow up as an outpatient:  TOC followup in our office next week  For questions or updates, please contact [facility] Please consult [email or web address] for contact info under Cardiology/STEMI.      Signed, Armanda Magicraci Turner, MD  09/23/2017, 1:18 PM

## 2017-09-23 NOTE — Progress Notes (Signed)
I have sent a message to our office's scheduling team requesting a follow-up appointment, and our office will call the patient with this information. Lab order placed for Monday for BMET per Dr. Norris Crossurner's request - added info to AVS regarding pt instructions. Dayna Dunn PA-C

## 2017-09-23 NOTE — Progress Notes (Addendum)
Pt got discharged to assisted living facility back, discharge instructions provided and patient showed understanding to it, IV taken out,Telemonitor DC,pt left unit in wheelchair with all of the belongings accompanied with a family member (Daughter)  Lonia Farberekha, RCharity fundraiser

## 2017-09-23 NOTE — Discharge Summary (Addendum)
Physician Discharge Summary  Suzanne Howard ZOX:[id number] DOB: 1924/10/16 DOA: 09/20/2017  PCP: Mila Palmer, MD  Admit date: 09/20/2017 Discharge date: 09/23/2017  Recommendations for Outpatient Follow-up:   BMP ordered  Suspected CKD stage III --Renal function remains stable.    Follow-up BMP as an outpatient.  Dilated ascending aorta -2D echocardiogram did not show any dilatation but consider chest CT angios for complete evaluation as outpt once renal function improves   Follow-up Information    [facility] Follow up.   Specialty:  Cardiology Why:  Come to the office on Monday 09/25/17 for labwork (BMET) anytime between 8am-4pm. The office will call you for your follow-up visit. Contact information: [address] [place] [address] [phone]           Discharge Diagnoses:  1. NSTEMI 2. Severe three-vessel coronary artery disease 3. Acute diastolic CHF 4. Moderate aortic stenosis 5. Suspected CKD stage III 6. Dilated ascending aorta 7. Essential hypertension  Discharge Condition: improved Disposition: [facility]  Diet recommendation: heart healthy  Filed Weights   09/21/17 0551 09/22/17 0600 09/23/17 0410  Weight: 78.7 kg 78 kg 78.3 kg    History of present illness:  84 year old woman PMH moderate aortic stenosis presented with shortness of breath and exertional chest pain.  Admitted for further evaluation.  Hospital Course:  Patient ruled in for NSTEMI.  She was seen by cardiology, stabilized medically and then underwent catheterization which revealed severe three-vessel coronary artery disease.  Patient elected medical management.  Hospitalization was uncomplicated.  Individual issues as below.  NSTEMI, catheterization showed severe three-vessel coronary artery disease.   --Patient declined CABG.  Medical management recommended.  Ranexa added for angina.  Avoid nitrates. --Follow-up with  cardiology as an outpatient   Acute diastolic CHF, thought to be combination of moderate aortic stenosis, underlying coronary ischemia --Per cardiology restart Lasix 20 mg daily, outpatient BMP 8/12 --Continue ASA 81mg  daily, Plavix 75mg  daily, Toprol XL 50mg  daily, Ranexa 500mg  bid and statin.  --increase simvastatin to 40mg  daily and repeat FLP and ALT in 6 weeks --would avoid Imdur with AS  Moderate aortic stenosis.  Further management per cardiology.  Suspected CKD stage III --Renal function remains stable.    Follow-up BMP as an outpatient.  Dilated ascending aorta -2D echocardiogram did not show any dilatation but once renal function has improved consider chest CT angios for complete evaluation as outpt once renal function improves  Essential hypertension --Remains stable.  Continue beta-blocker.  Consultants:  Cardiology   Procedures:  [facility] FINDINGS: 1. Severe 3-vessel CAD, with heavy calcification of the coronaries. 2. Diffusely calcified ascending aorta. 3. Moderate aortic stenosis. 4. Upper normal to mildly elevated left and right heart filling pressures. 5. Normal Fick cardiac output/index.  RECOMMENDATIONS: 1. Gentle post-cath hydration today with holding of furosemide today. Restart furosemide tomorrow, as renal function allows. 2. Continue metoprolol; start ranolazine 500 mg BID. No percutaneous revascularization options. Cardiac surgery consultation for CABG/AVR could be considered, though given the patient's age and comorbities, she would be high risk for surgical intervention. Medical therapy may be her best option.  Echo Study Conclusions  - Left ventricle: The cavity size was normal. Wall thickness was increased in a pattern of mild LVH. The estimated ejection fraction was 55%. Although no diagnostic regional wall motion abnormality was identified, this possibility cannot be completely excluded on the basis of this study. Doppler  parameters are consistent with abnormal left ventricular relaxation (grade 1 diastolic  dysfunction). - Aortic valve: Trileaflet; severely calcified leaflets. There was moderate stenosis. Mean gradient (S): 29 mm Hg. Peak gradient (S): 44 mm Hg. Valve area (VTI): 1.2 cm^2. - Mitral valve: Moderately calcified annulus. There was no evidence for stenosis. There was mild regurgitation. Mean gradient (D): 3 mm Hg. Valve area by pressure half-time: 2.88 cm^2. - Left atrium: The atrium was severely dilated. - Right ventricle: The cavity size was normal. Systolic function was normal. - Pulmonary arteries: No complete TR doppler jet so unable to estimate PA systolic pressure. - Inferior vena cava: The vessel was normal in size. The respirophasic diameter changes were in the normal range (>= 50%), consistent with normal central venous pressure.  Impressions:  - Normal LV size with mild LV hypertrophy. EF 55%. Normal RV size and systolic function. Moderate aortic stenosis. Mild mitral regurgitation. LAE.  Today's assessment: S: feels well, no complaints O: Vitals:  Vitals:   09/23/17 0818 09/23/17 1225  BP: (!) 141/75 132/70  Pulse: 77 69  Resp:  18  Temp:  98.1 F (36.7 C)  SpO2: 97% 97%    Constitutional:  . Appears calm and comfortable Respiratory:  . CTA bilaterally, no w/r/r.  . Respiratory effort normal.  Cardiovascular:  . RRR, 2/6 holosystolic murmur, no r/g . No LE extremity edema   Psychiatric:  . Mental status o Mood, affect appropriate  Creatinine stable, 1.62.  Discharge Instructions  Discharge Instructions    Diet - low sodium heart healthy   Complete by:  As directed    Discharge instructions   Complete by:  As directed    Call your physician or seek immediate medical attention for chest pain, shortness of breath or worsening of condition.   Increase activity slowly   Complete by:  As directed      Allergies as of 09/23/2017       Reactions   Betadine [povidone Iodine] Itching   Pt can tolerate shrimp and shellfish      Medication List    STOP taking these medications   lisinopril-hydrochlorothiazide 20-25 MG tablet Commonly known as:  PRINZIDE,ZESTORETIC     TAKE these medications   ALPRAZolam 0.25 MG tablet Commonly known as:  XANAX Take 0.25 mg by mouth at bedtime.   aspirin 81 MG EC tablet Take 1 tablet (81 mg total) by mouth daily. Start taking on:  09/24/2017   clopidogrel 75 MG tablet Commonly known as:  PLAVIX Take 1 tablet (75 mg total) by mouth daily.   dextromethorphan-guaiFENesin 30-600 MG 12hr tablet Commonly known as:  MUCINEX DM Take 1 tablet by mouth 2 (two) times daily. What changed:    when to take this  reasons to take this   furosemide 20 MG tablet Commonly known as:  LASIX Take 1 tablet (20 mg total) by mouth daily.   ipratropium 0.06 % nasal spray Commonly known as:  ATROVENT Place 2 sprays into both nostrils 4 (four) times daily. What changed:    when to take this  reasons to take this   metoprolol succinate 50 MG 24 hr tablet Commonly known as:  TOPROL-XL Take 1 tablet (50 mg total) by mouth daily. Take with or immediately following a meal.   potassium chloride SA 20 MEQ tablet Commonly known as:  K-DUR,KLOR-CON Take 1 tablet (20 mEq total) by mouth daily. Start taking on:  09/24/2017   ranolazine 500 MG 12 hr tablet Commonly known as:  RANEXA Take 1 tablet (500 mg total) by mouth 2 (two)  times daily.   simvastatin 40 MG tablet Commonly known as:  ZOCOR Take 1 tablet (40 mg total) by mouth at bedtime. What changed:    medication strength  how much to take      Allergies  Allergen Reactions  . Betadine [Povidone Iodine] Itching    Pt can tolerate shrimp and shellfish    The results of significant diagnostics from this hospitalization (including imaging, microbiology, ancillary and laboratory) are listed below for reference.    Significant  Diagnostic Studies: Dg Chest Portable 1 View  Result Date: 09/20/2017 CLINICAL DATA:  Subacute onset of respiratory difficulties and diarrhea. Wheezing. EXAM: PORTABLE CHEST 1 VIEW COMPARISON:  Chest radiograph performed 04/29/2011 FINDINGS: The lungs are well-aerated. Vascular congestion is noted. Bibasilar airspace opacities may reflect interstitial edema or possibly pneumonia. No significant pleural effusion or pneumothorax is seen. The cardiomediastinal silhouette is within normal limits. No acute osseous abnormalities are seen. Thoracolumbar spinal fusion hardware is partially imaged. IMPRESSION: Vascular congestion noted. Bibasilar airspace opacities may reflect interstitial edema or possibly pneumonia. Electronically Signed   By: Roanna Raider M.D.   On: 09/20/2017 06:07    Microbiology: Recent Results (from the past 240 hour(s))  MRSA PCR Screening     Status: None   Collection Time: 09/20/17  6:59 PM  Result Value Ref Range Status   MRSA by PCR NEGATIVE NEGATIVE Final    Comment:        The GeneXpert MRSA Assay (FDA approved for NASAL specimens only), is one component of a comprehensive MRSA colonization surveillance program. It is not intended to diagnose MRSA infection nor to guide or monitor treatment for MRSA infections. Performed at [facility], [address], [place], [place] [address]      Labs: Basic Metabolic Panel: Recent Labs  Lab 09/20/17 0612 09/21/17 0205 09/22/17 0544 09/23/17 0817  NA 140 141 140 140  K 3.1* 3.4* 3.8 4.2  CL 105 106 104 106  CO2 22 24 24 24   GLUCOSE 126* 99 96 103*  BUN 24* 22 26* 21  CREATININE 1.57* 1.51* 1.55* 1.62*  CALCIUM 9.0 8.6* 8.8* 8.9  MG 1.8  --   --   --    Liver Function Tests: Recent Labs  Lab 09/20/17 0612  AST 25  ALT 15  ALKPHOS 99  BILITOT 0.8  PROT 6.8  ALBUMIN 3.8   CBC: Recent Labs  Lab 09/19/17 1153 09/20/17 0612 09/21/17 0205 09/22/17 0544 09/23/17 0233  WBC 6.7 9.2 5.3 5.7 5.3    HGB 11.6 12.7 11.0* 11.0* 10.9*  HCT 36.2 39.6 33.2* 33.7* 33.4*  MCV 91 91.0 89.5 90.1 90.8  PLT 210 178 153 157 157   Cardiac Enzymes: Recent Labs  Lab 09/20/17 1606 09/20/17 2315 09/21/17 0205  TROPONINI 2.84* 2.53* 2.52*   ) Recent Labs    09/20/17 0726  BNP 905.4*     Principal Problem:   NSTEMI (non-ST elevated myocardial infarction) (HCC) Active Problems:   Aortic stenosis   Essential hypertension   Acute on chronic diastolic (congestive) heart failure (HCC)   Acute kidney injury (HCC)   Hypertension   CKD (chronic kidney disease)   Pressure injury of skin   Time coordinating discharge: 35 minutes  Signed:  Brendia Sacks, MD [facility] 09/23/2017, 2:21 PM

## 2017-09-23 NOTE — Progress Notes (Signed)
ANTICOAGULATION CONSULT NOTE - Follow Up Consult  Pharmacy Consult for Heparin Indication: chest pain/ACS  Allergies  Allergen Reactions  . Betadine [Povidone Iodine] Itching    Pt can tolerate shrimp and shellfish    Patient Measurements: Height: 5\' 3"  (160 cm) Weight: 172 lb (78 kg)(scale c) IBW/kg (Calculated) : 52.4 Heparin Dosing Weight:  69 kg  Vital Signs: Temp: 97.9 F (36.6 C) (08/09 2120) Temp Source: Oral (08/09 2120) BP: 130/78 (08/09 2120) Pulse Rate: 85 (08/09 2120)  Labs: Recent Labs    09/20/17 0612 09/20/17 1606 09/20/17 2315  09/21/17 0205 09/21/17 0900 09/22/17 0544 09/23/17 0233  HGB 12.7  --   --   --  11.0*  --  11.0* 10.9*  HCT 39.6  --   --   --  33.2*  --  33.7* 33.4*  PLT 178  --   --   --  153  --  157 157  HEPARINUNFRC  --   --   --    < > 0.22* 0.32 0.29* 0.29*  CREATININE 1.57*  --   --   --  1.51*  --  1.55*  --   TROPONINI  --  2.84* 2.53*  --  2.52*  --   --   --    < > = values in this interval not displayed.    Estimated Creatinine Clearance: 23.4 mL/min (A) (by C-G formula based on SCr of 1.55 mg/dL (H)).   Assessment: 84 year old female continues on heparin for chest pain S/p cath with severe 3 vessel disease, heparin to resume pending CVTS consult -heparin level= 0.29  Goal of Therapy:  Heparin level 0.3-0.7 units/ml Monitor platelets by anticoagulation protocol: Yes   Plan:   -Increase heparin to 1150 units/hr -Daily heparin level and CBC  Harland GermanAndrew Margaretann Abate, PharmD Clinical Pharmacist Please check Amion for pharmacy contact number  09/23/2017,3:19 AM

## 2017-09-24 ENCOUNTER — Emergency Department (HOSPITAL_COMMUNITY): Payer: Medicare Other

## 2017-09-24 ENCOUNTER — Inpatient Hospital Stay (HOSPITAL_COMMUNITY)
Admission: EM | Admit: 2017-09-24 | Discharge: 2017-09-26 | DRG: 280 | Disposition: A | Discharge status: Home / Self Care | Payer: Medicare Other | Source: Skilled Nursing Facility | Attending: Family Medicine | Admitting: Family Medicine

## 2017-09-24 ENCOUNTER — Encounter (HOSPITAL_COMMUNITY): Payer: Self-pay | Admitting: Emergency Medicine

## 2017-09-24 DIAGNOSIS — Z7902 Long term (current) use of antithrombotics/antiplatelets: Secondary | ICD-10-CM | POA: Diagnosis not present

## 2017-09-24 DIAGNOSIS — N184 Chronic kidney disease, stage 4 (severe): Secondary | ICD-10-CM | POA: Diagnosis present

## 2017-09-24 DIAGNOSIS — Z9841 Cataract extraction status, right eye: Secondary | ICD-10-CM | POA: Diagnosis not present

## 2017-09-24 DIAGNOSIS — I77810 Thoracic aortic ectasia: Secondary | ICD-10-CM | POA: Diagnosis present

## 2017-09-24 DIAGNOSIS — E7800 Pure hypercholesterolemia, unspecified: Secondary | ICD-10-CM | POA: Diagnosis not present

## 2017-09-24 DIAGNOSIS — Z7189 Other specified counseling: Secondary | ICD-10-CM | POA: Diagnosis not present

## 2017-09-24 DIAGNOSIS — Z8249 Family history of ischemic heart disease and other diseases of the circulatory system: Secondary | ICD-10-CM | POA: Diagnosis not present

## 2017-09-24 DIAGNOSIS — Z7982 Long term (current) use of aspirin: Secondary | ICD-10-CM | POA: Diagnosis not present

## 2017-09-24 DIAGNOSIS — Z8673 Personal history of transient ischemic attack (TIA), and cerebral infarction without residual deficits: Secondary | ICD-10-CM | POA: Diagnosis not present

## 2017-09-24 DIAGNOSIS — I10 Essential (primary) hypertension: Secondary | ICD-10-CM

## 2017-09-24 DIAGNOSIS — Z9842 Cataract extraction status, left eye: Secondary | ICD-10-CM | POA: Diagnosis not present

## 2017-09-24 DIAGNOSIS — I5033 Acute on chronic diastolic (congestive) heart failure: Secondary | ICD-10-CM | POA: Diagnosis present

## 2017-09-24 DIAGNOSIS — E785 Hyperlipidemia, unspecified: Secondary | ICD-10-CM | POA: Diagnosis present

## 2017-09-24 DIAGNOSIS — J810 Acute pulmonary edema: Secondary | ICD-10-CM

## 2017-09-24 DIAGNOSIS — I130 Hypertensive heart and chronic kidney disease with heart failure and stage 1 through stage 4 chronic kidney disease, or unspecified chronic kidney disease: Principal | ICD-10-CM | POA: Diagnosis present

## 2017-09-24 DIAGNOSIS — Z888 Allergy status to other drugs, medicaments and biological substances status: Secondary | ICD-10-CM

## 2017-09-24 DIAGNOSIS — N183 Chronic kidney disease, stage 3 unspecified: Secondary | ICD-10-CM | POA: Diagnosis present

## 2017-09-24 DIAGNOSIS — J449 Chronic obstructive pulmonary disease, unspecified: Secondary | ICD-10-CM | POA: Diagnosis present

## 2017-09-24 DIAGNOSIS — I350 Nonrheumatic aortic (valve) stenosis: Secondary | ICD-10-CM

## 2017-09-24 DIAGNOSIS — Z515 Encounter for palliative care: Secondary | ICD-10-CM

## 2017-09-24 DIAGNOSIS — E876 Hypokalemia: Secondary | ICD-10-CM | POA: Diagnosis not present

## 2017-09-24 DIAGNOSIS — N179 Acute kidney failure, unspecified: Secondary | ICD-10-CM | POA: Diagnosis present

## 2017-09-24 DIAGNOSIS — Z981 Arthrodesis status: Secondary | ICD-10-CM

## 2017-09-24 DIAGNOSIS — I214 Non-ST elevation (NSTEMI) myocardial infarction: Secondary | ICD-10-CM | POA: Diagnosis present

## 2017-09-24 DIAGNOSIS — I2510 Atherosclerotic heart disease of native coronary artery without angina pectoris: Secondary | ICD-10-CM

## 2017-09-24 DIAGNOSIS — I248 Other forms of acute ischemic heart disease: Secondary | ICD-10-CM | POA: Diagnosis present

## 2017-09-24 DIAGNOSIS — N170 Acute kidney failure with tubular necrosis: Secondary | ICD-10-CM | POA: Diagnosis not present

## 2017-09-24 DIAGNOSIS — Z66 Do not resuscitate: Secondary | ICD-10-CM | POA: Diagnosis present

## 2017-09-24 DIAGNOSIS — J9601 Acute respiratory failure with hypoxia: Secondary | ICD-10-CM | POA: Diagnosis present

## 2017-09-24 LAB — CBC WITH DIFFERENTIAL/PLATELET
ABS IMMATURE GRANULOCYTES: 0 10*3/uL (ref 0.0–0.1)
BASOS ABS: 0 10*3/uL (ref 0.0–0.1)
BASOS PCT: 1 %
EOS ABS: 0.2 10*3/uL (ref 0.0–0.7)
EOS PCT: 4 %
HCT: 38.5 % (ref 36.0–46.0)
Hemoglobin: 12.3 g/dL (ref 12.0–15.0)
Immature Granulocytes: 0 %
Lymphocytes Relative: 22 %
Lymphs Abs: 1.2 10*3/uL (ref 0.7–4.0)
MCH: 29.3 pg (ref 26.0–34.0)
MCHC: 31.9 g/dL (ref 30.0–36.0)
MCV: 91.7 fL (ref 78.0–100.0)
MONO ABS: 0.5 10*3/uL (ref 0.1–1.0)
MONOS PCT: 9 %
NEUTROS ABS: 3.3 10*3/uL (ref 1.7–7.7)
Neutrophils Relative %: 64 %
Platelets: 171 10*3/uL (ref 150–400)
RBC: 4.2 MIL/uL (ref 3.87–5.11)
RDW: 14.1 % (ref 11.5–15.5)
WBC: 5.2 10*3/uL (ref 4.0–10.5)

## 2017-09-24 LAB — LIPID PANEL
CHOLESTEROL: 179 mg/dL (ref 0–200)
HDL: 58 mg/dL (ref >40)
LDL Cholesterol: 88 mg/dL (ref 0–99)
TRIGLYCERIDES: 163 mg/dL — AB (ref <150)
Total CHOL/HDL Ratio: 3.1 RATIO
VLDL: 33 mg/dL (ref 0–40)

## 2017-09-24 LAB — I-STAT CHEM 8, ED
BUN: 35 mg/dL — ABNORMAL HIGH (ref 8–23)
CALCIUM ION: 1.14 mmol/L — AB (ref 1.15–1.40)
CHLORIDE: 105 mmol/L (ref 98–111)
Creatinine, Ser: 1.9 mg/dL — ABNORMAL HIGH (ref 0.44–1.00)
GLUCOSE: 137 mg/dL — AB (ref 70–99)
HCT: 37 % (ref 36.0–46.0)
Hemoglobin: 12.6 g/dL (ref 12.0–15.0)
Potassium: 4.8 mmol/L (ref 3.5–5.1)
SODIUM: 138 mmol/L (ref 135–145)
TCO2: 24 mmol/L (ref 22–32)

## 2017-09-24 LAB — I-STAT TROPONIN, ED: TROPONIN I, POC: 0.52 ng/mL — AB (ref 0.00–0.08)

## 2017-09-24 LAB — CREATININE, SERUM
Creatinine, Ser: 1.84 mg/dL — ABNORMAL HIGH (ref 0.44–1.00)
GFR, EST AFRICAN AMERICAN: 26 mL/min — AB (ref >60)
GFR, EST NON AFRICAN AMERICAN: 23 mL/min — AB (ref >60)

## 2017-09-24 LAB — TROPONIN I
TROPONIN I: 1.06 ng/mL — AB (ref <0.03)
TROPONIN I: 1.21 ng/mL — AB (ref <0.03)
Troponin I: 0.95 ng/mL (ref <0.03)

## 2017-09-24 LAB — MRSA PCR SCREENING: MRSA BY PCR: NEGATIVE

## 2017-09-24 LAB — BRAIN NATRIURETIC PEPTIDE: B Natriuretic Peptide: 904.6 pg/mL — ABNORMAL HIGH (ref 0.0–100.0)

## 2017-09-24 MED ORDER — POTASSIUM CHLORIDE CRYS ER 20 MEQ PO TBCR
20.0000 meq | EXTENDED_RELEASE_TABLET | Freq: Every day | ORAL | Status: DC
  Administered 2017-09-24 – 2017-09-26 (×3): 20 meq via ORAL
  Filled 2017-09-24 (×3): qty 1

## 2017-09-24 MED ORDER — ACETAMINOPHEN 325 MG PO TABS: 650.0000 mg | ORAL_TABLET | ORAL | Status: DC | PRN

## 2017-09-24 MED ORDER — RANOLAZINE ER 500 MG PO TB12
500.0000 mg | ORAL_TABLET | Freq: Two times a day (BID) | ORAL | Status: DC
  Administered 2017-09-24 – 2017-09-26 (×5): 500 mg via ORAL
  Filled 2017-09-24 (×5): qty 1

## 2017-09-24 MED ORDER — DM-GUAIFENESIN ER 30-600 MG PO TB12
1.0000 | ORAL_TABLET | Freq: Two times a day (BID) | ORAL | Status: DC | PRN
Start: 2017-09-24 — End: 2017-09-26

## 2017-09-24 MED ORDER — METOPROLOL SUCCINATE ER 50 MG PO TB24
50.0000 mg | ORAL_TABLET | Freq: Every day | ORAL | Status: DC
  Administered 2017-09-24 – 2017-09-26 (×3): 50 mg via ORAL
  Filled 2017-09-24 (×3): qty 1

## 2017-09-24 MED ORDER — ORAL CARE MOUTH RINSE
15.0000 mL | Freq: Two times a day (BID) | OROMUCOSAL | Status: DC
  Administered 2017-09-25 – 2017-09-26 (×2): 15 mL via OROMUCOSAL

## 2017-09-24 MED ORDER — HYDROXYZINE HCL 10 MG PO TABS
10.0000 mg | ORAL_TABLET | Freq: Three times a day (TID) | ORAL | Status: DC | PRN
  Administered 2017-09-26: 10 mg via ORAL
  Filled 2017-09-24: qty 1

## 2017-09-24 MED ORDER — ASPIRIN EC 81 MG PO TBEC
81.0000 mg | DELAYED_RELEASE_TABLET | Freq: Every day | ORAL | Status: DC
  Administered 2017-09-24 – 2017-09-26 (×3): 81 mg via ORAL
  Filled 2017-09-24 (×3): qty 1

## 2017-09-24 MED ORDER — FUROSEMIDE 10 MG/ML IJ SOLN
40.0000 mg | Freq: Once | INTRAMUSCULAR | Status: AC
  Administered 2017-09-24: 40 mg via INTRAVENOUS
  Filled 2017-09-24: qty 4

## 2017-09-24 MED ORDER — ALPRAZOLAM 0.25 MG PO TABS
0.2500 mg | ORAL_TABLET | Freq: Every day | ORAL | Status: DC
  Administered 2017-09-24 – 2017-09-25 (×2): 0.25 mg via ORAL
  Filled 2017-09-24 (×2): qty 1

## 2017-09-24 MED ORDER — SODIUM CHLORIDE 0.9% FLUSH
3.0000 mL | Freq: Two times a day (BID) | INTRAVENOUS | Status: DC
  Administered 2017-09-24 – 2017-09-26 (×4): 3 mL via INTRAVENOUS

## 2017-09-24 MED ORDER — HYDRALAZINE HCL 20 MG/ML IJ SOLN: 5.0000 mg | INTRAMUSCULAR | Status: DC | PRN

## 2017-09-24 MED ORDER — LEVALBUTEROL HCL 1.25 MG/0.5ML IN NEBU
1.2500 mg | INHALATION_SOLUTION | Freq: Four times a day (QID) | RESPIRATORY_TRACT | Status: DC
  Administered 2017-09-24 (×3): 1.25 mg via RESPIRATORY_TRACT
  Filled 2017-09-24 (×2): qty 0.5

## 2017-09-24 MED ORDER — ENOXAPARIN SODIUM 30 MG/0.3ML ~~LOC~~ SOLN
30.0000 mg | SUBCUTANEOUS | Status: DC
  Administered 2017-09-24 – 2017-09-25 (×2): 30 mg via SUBCUTANEOUS
  Filled 2017-09-24 (×2): qty 0.3

## 2017-09-24 MED ORDER — CLOPIDOGREL BISULFATE 75 MG PO TABS
75.0000 mg | ORAL_TABLET | Freq: Every day | ORAL | Status: DC
  Administered 2017-09-24 – 2017-09-26 (×3): 75 mg via ORAL
  Filled 2017-09-24 (×3): qty 1

## 2017-09-24 MED ORDER — ATORVASTATIN CALCIUM 20 MG PO TABS
20.0000 mg | ORAL_TABLET | Freq: Every day | ORAL | Status: DC
  Administered 2017-09-24 – 2017-09-25 (×2): 20 mg via ORAL
  Filled 2017-09-24 (×2): qty 1

## 2017-09-24 MED ORDER — SODIUM CHLORIDE 0.9% FLUSH: 3.0000 mL | INTRAVENOUS | Status: DC | PRN

## 2017-09-24 MED ORDER — FUROSEMIDE 10 MG/ML IJ SOLN
40.0000 mg | Freq: Two times a day (BID) | INTRAMUSCULAR | Status: DC
  Administered 2017-09-24 – 2017-09-25 (×3): 40 mg via INTRAVENOUS
  Filled 2017-09-24 (×3): qty 4

## 2017-09-24 MED ORDER — SIMVASTATIN 40 MG PO TABS: 40.0000 mg | ORAL_TABLET | Freq: Every day | ORAL | Status: DC

## 2017-09-24 MED ORDER — ALBUTEROL SULFATE (2.5 MG/3ML) 0.083% IN NEBU
5.0000 mg | INHALATION_SOLUTION | Freq: Once | RESPIRATORY_TRACT | Status: AC
  Administered 2017-09-24: 5 mg via RESPIRATORY_TRACT
  Filled 2017-09-24: qty 6

## 2017-09-24 MED ORDER — ZOLPIDEM TARTRATE 5 MG PO TABS: 5.0000 mg | ORAL_TABLET | Freq: Every evening | ORAL | Status: DC | PRN

## 2017-09-24 MED ORDER — SODIUM CHLORIDE 0.9 % IV SOLN: 250.0000 mL | INTRAVENOUS | Status: DC | PRN

## 2017-09-24 MED ORDER — LEVALBUTEROL HCL 1.25 MG/0.5ML IN NEBU: 1.2500 mg | INHALATION_SOLUTION | Freq: Four times a day (QID) | RESPIRATORY_TRACT | Status: DC

## 2017-09-24 NOTE — Progress Notes (Signed)
Contact  daughter Leodis Rainsnne Dehart [phone].  Number on file is patient home number and husband is in a [facility].

## 2017-09-24 NOTE — Progress Notes (Signed)
Patient has conflicting respiratory orders, one for continuous BiPAP and one for continuous nasal cannula. Patient requesting to eat. Awaiting response from Dr. Janee Mornhompson to see if this is advisable.

## 2017-09-24 NOTE — Progress Notes (Signed)
Patient arrived via EMS on CPAP.  Placed on BIPAP.

## 2017-09-24 NOTE — H&P (Signed)
History and Physical    Suzanne Howard:[id number] DOB: [birth year] DOA: 09/24/2017  Referring MD/NP/PA:   PCP: Suzanne Palmer, MD   Patient coming from:  The patient is coming from ALS.  At baseline, pt is partially dependent for most of ADL.   Chief Complaint: SOB  HPI: Suzanne Howard is a 84 y.o. female with medical history significant of hypertension, hyperlipidemia, stroke, aortic stenosis, CKD-3, dilated ascending aorta, dCHF, non-STEMI, who presents with shortness of breath.  Patient was recently hospitalized from 8/7-8/10 because of non-STEMI with troponin up to 2.8 and CHF.  Patient declined CABG.  She was discharged at stable condition on Lasix 20 mg daily yesterday. Pt states that she continues to have shortness of breath, which has been progressively getting worse after she went home. She can barely speaking full sentences.  The she has dry cough, but no fever chills.  Denies any chest pain.  She was found to have oxygen desaturation to 85% on room air.  Patient does not have nausea, vomiting, diarrhea, abdominal pain, symptoms of UTI or unilateral weakness.  ED Course: pt was found to have troponin 0 0.52, BNP 904, worsening renal function, temperature normal, tachycardia, tachypnea, chest x-ray showed pulmonary edema.  Patient is admitted to stepdown as inpatient.  BiPAP was started in ED.  Review of Systems:   General: no fevers, chills, has poor appetite, has fatigue HEENT: no blurry vision, hearing changes or sore throat Respiratory: has dyspnea, coughing, no wheezing CV: no chest pain, no palpitations GI: no nausea, vomiting, abdominal pain, diarrhea, constipation GU: no dysuria, burning on urination, increased urinary frequency, hematuria  Ext: has leg edema Neuro: no unilateral weakness, numbness, or tingling, no vision change or hearing loss Skin: no rash, no skin tear. MSK: No muscle spasm, no deformity, no limitation of range of movement in spin Heme:  No easy bruising.  Travel history: No recent long distant travel.  Allergy:  Allergies  Allergen Reactions  . Betadine [Povidone Iodine] Itching    Pt can tolerate shrimp and shellfish    Past Medical History:  Diagnosis Date  . Aortic stenosis   . CKD (chronic kidney disease)   . DOE (dyspnea on exertion)   . Exertional chest pain   . History of pneumonia    as a child  . Hyperlipidemia   . Hypertension   . Nephrolithiasis   . Stroke (HCC) 1998   no residual effects  . Thoracic spondylosis with myelopathy   . Thoracic stenosis     Past Surgical History:  Procedure Laterality Date  . APPENDECTOMY     as a teenager  . CATARACT EXTRACTION, BILATERAL    . ENDOMETRIAL ABLATION    . LITHOTRIPSY  2012  . THORACIC FUSION    . TONSILLECTOMY AND ADENOIDECTOMY      Social History:  reports that she has never smoked. She has never used smokeless tobacco. She reports that she does not drink alcohol or use drugs.  Family History:  Family History  Problem Relation Age of Onset  . Heart disease Father   . Anesthesia problems Neg Hx      Prior to Admission medications   Medication Sig Start Date End Date Taking? Authorizing Provider  ALPRAZolam (XANAX) 0.25 MG tablet Take 0.25 mg by mouth at bedtime. 08/08/17  Yes [provider]  aspirin EC 81 MG EC tablet Take 1 tablet (81 mg total) by mouth daily. 09/24/17  Yes Standley Brooking, MD  clopidogrel (  PLAVIX) 75 MG tablet Take 1 tablet (75 mg total) by mouth daily. 12/15/14  Yes Lewayne Buntingrenshaw, Brian S, MD  dextromethorphan-guaiFENesin [facility](MUCINEX DM) 30-600 MG 12hr tablet Take 1 tablet by mouth 2 (two) times daily. Patient taking differently: Take 1 tablet by mouth 2 (two) times daily as needed for cough.  07/04/15  Yes Ofilia Neaslark, Michael L, PA-C  furosemide (LASIX) 20 MG tablet Take 1 tablet (20 mg total) by mouth daily. 09/23/17 09/23/18 Yes Standley BrookingGoodrich, Daniel P, MD  ipratropium (ATROVENT) 0.06 % nasal spray Place 2 sprays into both  nostrils 4 (four) times daily. Patient taking differently: Place 2 sprays into both nostrils as needed.  07/04/15  Yes Ofilia Neaslark, Michael L, PA-C  metoprolol succinate (TOPROL-XL) 50 MG 24 hr tablet Take 1 tablet (50 mg total) by mouth daily. Take with or immediately following a meal. 12/15/14  Yes Crenshaw, Madolyn FriezeBrian S, MD  potassium chloride SA (K-DUR,KLOR-CON) 20 MEQ tablet Take 1 tablet (20 mEq total) by mouth daily. 09/24/17  Yes Standley BrookingGoodrich, Daniel P, MD  ranolazine (RANEXA) 500 MG 12 hr tablet Take 1 tablet (500 mg total) by mouth 2 (two) times daily. 09/23/17  Yes Standley BrookingGoodrich, Daniel P, MD  simvastatin (ZOCOR) 40 MG tablet Take 1 tablet (40 mg total) by mouth at bedtime. 09/23/17  Yes Standley BrookingGoodrich, Daniel P, MD    Physical Exam: Vitals:   09/24/17 0355 09/24/17 0415 09/24/17 0445 09/24/17 0530  BP: (!) 114/55 114/67 123/68 123/64  Pulse: 87 87 87 83  Resp: 18 (!) 23 (!) 21 17  Temp:      TempSrc:      SpO2: 98% 96% 99% 98%   General: Not in acute distress HEENT:       Eyes: PERRL, EOMI, no scleral icterus.       ENT: No discharge from the ears and nose, no pharynx injection, no tonsillar enlargement.        Neck: positive JVD, no bruit, no mass felt. Heme: No neck lymph node enlargement. Cardiac: S1/S2, RRR, No murmurs, No gallops or rubs. Respiratory:  No rales, wheezing, rhonchi or rubs. GI: Soft, nondistended, nontender, no rebound pain, no organomegaly, BS present. GU: No hematuria Ext: 1+ pitting leg edema bilaterally. 2+DP/PT pulse bilaterally. Musculoskeletal: No joint deformities, No joint redness or warmth, no limitation of ROM in spin. Skin: No rashes.  Neuro: Alert, oriented X3, cranial nerves II-XII grossly intact, moves all extremities normally.  Psych: Patient is not psychotic, no suicidal or hemocidal ideation.  Labs on Admission: I have personally reviewed following labs and imaging studies  CBC: Recent Labs  Lab 09/20/17 0612 09/21/17 0205 09/22/17 0544 09/23/17 0233  09/24/17 0118 09/24/17 0134  WBC 9.2 5.3 5.7 5.3 5.2  --   NEUTROABS  --   --   --   --  3.3  --   HGB 12.7 11.0* 11.0* 10.9* 12.3 12.6  HCT 39.6 33.2* 33.7* 33.4* 38.5 37.0  MCV 91.0 89.5 90.1 90.8 91.7  --   PLT 178 153 157 157 171  --    Basic Metabolic Panel: Recent Labs  Lab 09/20/17 0612 09/21/17 0205 09/22/17 0544 09/23/17 0817 09/24/17 0134  NA 140 141 140 140 138  K 3.1* 3.4* 3.8 4.2 4.8  CL 105 106 104 106 105  CO2 22 24 24 24   --   GLUCOSE 126* 99 96 103* 137*  BUN 24* 22 26* 21 35*  CREATININE 1.57* 1.51* 1.55* 1.62* 1.90*  CALCIUM 9.0 8.6* 8.8* 8.9  --  MG 1.8  --   --   --   --    GFR: Estimated Creatinine Clearance: 19.1 mL/min (A) (by C-G formula based on SCr of 1.9 mg/dL (H)). Liver Function Tests: Recent Labs  Lab 09/20/17 0612  AST 25  ALT 15  ALKPHOS 99  BILITOT 0.8  PROT 6.8  ALBUMIN 3.8   No results for input(s): LIPASE, AMYLASE in the last 168 hours. No results for input(s): AMMONIA in the last 168 hours. Coagulation Profile: No results for input(s): INR, PROTIME in the last 168 hours. Cardiac Enzymes: Recent Labs  Lab 09/20/17 1606 09/20/17 2315 09/21/17 0205  TROPONINI 2.84* 2.53* 2.52*   BNP (last 3 results) No results for input(s): PROBNP in the last 8760 hours. HbA1C: No results for input(s): HGBA1C in the last 72 hours. CBG: No results for input(s): GLUCAP in the last 168 hours. Lipid Profile: No results for input(s): CHOL, HDL, LDLCALC, TRIG, CHOLHDL, LDLDIRECT in the last 72 hours. Thyroid Function Tests: No results for input(s): TSH, T4TOTAL, FREET4, T3FREE, THYROIDAB in the last 72 hours. Anemia Panel: No results for input(s): VITAMINB12, FOLATE, FERRITIN, TIBC, IRON, RETICCTPCT in the last 72 hours. Urine analysis:    Component Value Date/Time   COLORURINE YELLOW 04/29/2011 1404   APPEARANCEUR HAZY (A) 04/29/2011 1404   LABSPEC 1.021 04/29/2011 1404   PHURINE 5.5 04/29/2011 1404   GLUCOSEU NEGATIVE 04/29/2011  1404   HGBUR NEGATIVE 04/29/2011 1404   BILIRUBINUR neg 04/30/2012 1603   KETONESUR NEGATIVE 04/29/2011 1404   PROTEINUR neg 04/30/2012 1603   PROTEINUR NEGATIVE 04/29/2011 1404   UROBILINOGEN 0.2 04/30/2012 1603   UROBILINOGEN 0.2 04/29/2011 1404   NITRITE neg 04/30/2012 1603   NITRITE NEGATIVE 04/29/2011 1404   LEUKOCYTESUR Negative 04/30/2012 1603   Sepsis Labs: @LABRCNTIP (procalcitonin:4,lacticidven:4) ) Recent Results (from the past 240 hour(s))  MRSA PCR Screening     Status: None   Collection Time: 09/20/17  6:59 PM  Result Value Ref Range Status   MRSA by PCR NEGATIVE NEGATIVE Final    Comment:        The GeneXpert MRSA Assay (FDA approved for NASAL specimens only), is one component of a comprehensive MRSA colonization surveillance program. It is not intended to diagnose MRSA infection nor to guide or monitor treatment for MRSA infections. Performed at [facility], [address], [place], [place] [address]      Radiological Exams on Admission: Dg Chest Portable 1 View  Result Date: 09/24/2017 CLINICAL DATA:  Difficulty breathing hypoxia on room air. Diminished breath sounds. EXAM: PORTABLE CHEST 1 VIEW COMPARISON:  09/20/2017 FINDINGS: Stable heart size and mediastinal contours with aortic atherosclerosis and borderline cardiac silhouette size. Mild diffuse interstitial edema is noted with trace left effusion. No acute osseous abnormality is noted. Spinal fusion hardware is noted along the midthoracic spine extending caudad. IMPRESSION: Mild interstitial edema with trace left effusion. Electronically Signed   By: Tollie Eth M.D.   On: 09/24/2017 02:20     EKG: Independently reviewed.  Sinus rhythm, QTC 505, low voltage, ST depression diffusely.   Assessment/Plan Principal Problem:   Acute on chronic diastolic (congestive) heart failure (HCC) Active Problems:   Essential hypertension   Hyperlipidemia   Hypertension   Acute renal failure superimposed  on stage 3 chronic kidney disease (HCC)   NSTEMI (non-ST elevated myocardial infarction) (HCC)   Acute respiratory failure with hypoxia (HCC)   Acute respiratory failure with hypoxia and acute on chronic diastolic (congestive) heart failure (HCC):  Patient has worsening shortness of breath, elevated BNP, positive JVD, consistent with CHF exacerbation.  Patient just had 2D echo on 09/20/2017, which showed EF of 55% with grade 1 diastolic dysfunction.  -will admit to SDU as inpt. -Lasix 40 mg bid by IV -trop x 3 -Daily weights -strict I/O's -Low salt diet -continue BiPAP -PRN Xopenex nebulizer shortness breath  NSTEMI (non-ST elevated myocardial infarction: pt had NSTEMI in recent admission with troponin up to 2.8.  Today troponin is 0.52.  No chest pain.  Patient declined CABG in previous admission. -Continue aspirin, Plavix, Zocor, ranolazine, metoprolol  Essential hypertension: -Metoprolol and IV Lasix -IV hydralazine as needed  Hyperlipidemia: -Zocor  Acute renal failure superimposed on stage 3 chronic kidney disease (HCC): Baseline creatinine 1.5, creatinine 7.90, BUN 35.  Likely due to cardiorenal syndrome. -f/u renal closely by BMP   DVT ppx:  SQ Lovenox Code Status: DNR (I discussed with patient in the presence of her daughter who is POA, and explained the meaning of CODE STATUS. Patient wants to be DNR) Family Communication:   Yes, patient's daughter at bed side Disposition Plan:  Anticipate discharge back to previous ALS Consults called:  none Admission status:SDU/inpation       Date of Service 09/24/2017    Lorretta Harp [facility] Pager [phone]  If 7PM-7AM, please contact night-coverage [email or web address] Password [facility] 09/24/2017, 5:40 AM

## 2017-09-24 NOTE — Progress Notes (Addendum)
Progress Note  Patient Name: Suzanne Howard Date of Encounter: 09/24/2017  Primary Cardiologist: Olga Millers, MD   Subjective   Doing well yesterday without any chest pain or SOB.  Discharged yesterday in stable condition but became acutely SOB in bed last night  which became worse and presented back to ER with O2 Sats 85% on RA with pulmonary edema on cxray requiring BIPAP and BNP 904.  Given Lasix 40mg  IV BID. Breathing much improved and off BiPAP.  O2 sat 100% on 3L O2  Inpatient Medications    Scheduled Meds: . ALPRAZolam  0.25 mg Oral QHS  . aspirin EC  81 mg Oral Daily  . clopidogrel  75 mg Oral Daily  . enoxaparin (LOVENOX) injection  30 mg Subcutaneous Q24H  . furosemide  40 mg Intravenous BID  . levalbuterol  1.25 mg Nebulization Q6H  . mouth rinse  15 mL Mouth Rinse BID  . metoprolol succinate  50 mg Oral Daily  . potassium chloride SA  20 mEq Oral Daily  . ranolazine  500 mg Oral BID  . simvastatin  40 mg Oral QHS  . sodium chloride flush  3 mL Intravenous Q12H   Continuous Infusions: . sodium chloride     PRN Meds: sodium chloride, acetaminophen, dextromethorphan-guaiFENesin, hydrALAZINE, hydrOXYzine, sodium chloride flush, zolpidem   Vital Signs    Vitals:   09/24/17 0800 09/24/17 0803 09/24/17 0809 09/24/17 0816  BP:   132/78   Pulse:  79 81   Resp:      Temp:   (!) 97.1 F (36.2 C)   TempSrc:   Axillary   SpO2: 100% 100% 100% 100%  Weight:       No intake or output data in the 24 hours ending 09/24/17 1209 Filed Weights   09/24/17 0348  Weight: 80.8 kg    Telemetry    NSR- Personally Reviewed  ECG   Sinus tachycardia with diffuse ST/T wave abnormality- Personally Reviewed  Physical Exam   GEN: No acute distress.   Neck: No JVD Cardiac: RRR, no rubs, or gallops.  2/6 mid peaking systolic murmur the right upper sternal border radiating to left lower sternal border Respiratory: Clear to auscultation bilaterally. GI: Soft,  nontender, non-distended  MS: No edema; No deformity. Neuro:  Nonfocal  Psych: Normal affect   Labs    Chemistry Recent Labs  Lab 09/20/17 0612 09/21/17 0205 09/22/17 0544 09/23/17 0817 09/24/17 0134 09/24/17 0439  NA 140 141 140 140 138  --   K 3.1* 3.4* 3.8 4.2 4.8  --   CL 105 106 104 106 105  --   CO2 22 24 24 24   --   --   GLUCOSE 126* 99 96 103* 137*  --   BUN 24* 22 26* 21 35*  --   CREATININE 1.57* 1.51* 1.55* 1.62* 1.90* 1.84*  CALCIUM 9.0 8.6* 8.8* 8.9  --   --   PROT 6.8  --   --   --   --   --   ALBUMIN 3.8  --   --   --   --   --   AST 25  --   --   --   --   --   ALT 15  --   --   --   --   --   ALKPHOS 99  --   --   --   --   --   BILITOT 0.8  --   --   --   --   --  GFRNONAA 28* 29* 28* 27*  --  23*  GFRAA 32* 34* 33* 31*  --  26*  ANIONGAP 13 11 12 10   --   --      Hematology Recent Labs  Lab 09/22/17 0544 09/23/17 0233 09/24/17 0118 09/24/17 0134  WBC 5.7 5.3 5.2  --   RBC 3.74* 3.68* 4.20  --   HGB 11.0* 10.9* 12.3 12.6  HCT 33.7* 33.4* 38.5 37.0  MCV 90.1 90.8 91.7  --   MCH 29.4 29.6 29.3  --   MCHC 32.6 32.6 31.9  --   RDW 14.1 14.4 14.1  --   PLT 157 157 171  --     Cardiac Enzymes Recent Labs  Lab 09/20/17 1606 09/20/17 2315 09/21/17 0205 09/24/17 0921  TROPONINI 2.84* 2.53* 2.52* 1.06*    Recent Labs  Lab 09/20/17 0729 09/24/17 0131  TROPIPOC 0.54* 0.52*     BNP Recent Labs  Lab 09/20/17 0726 09/24/17 0119  BNP 905.4* 904.6*     DDimer No results for input(s): DDIMER in the last 168 hours.   Radiology    Dg Chest Portable 1 View  Result Date: 09/24/2017 CLINICAL DATA:  Difficulty breathing hypoxia on room air. Diminished breath sounds. EXAM: PORTABLE CHEST 1 VIEW COMPARISON:  09/20/2017 FINDINGS: Stable heart size and mediastinal contours with aortic atherosclerosis and borderline cardiac silhouette size. Mild diffuse interstitial edema is noted with trace left effusion. No acute osseous abnormality is noted.  Spinal fusion hardware is noted along the midthoracic spine extending caudad. IMPRESSION: Mild interstitial edema with trace left effusion. Electronically Signed   By: Tollie Eth M.D.   On: 09/24/2017 02:20    Cardiac Studies   2D echo  Study Conclusions  - Left ventricle: The cavity size was normal. Wall thickness was   increased in a pattern of mild LVH. The estimated ejection   fraction was 55%. Although no diagnostic regional wall motion   abnormality was identified, this possibility cannot be completely   excluded on the basis of this study. Doppler parameters are   consistent with abnormal left ventricular relaxation (grade 1   diastolic dysfunction). - Aortic valve: Trileaflet; severely calcified leaflets. There was   moderate stenosis. Mean gradient (S): 29 mm Hg. Peak gradient   (S): 44 mm Hg. Valve area (VTI): 1.2 cm^2. - Mitral valve: Moderately calcified annulus. There was no evidence   for stenosis. There was mild regurgitation. Mean gradient (D): 3   mm Hg. Valve area by pressure half-time: 2.88 cm^2. - Left atrium: The atrium was severely dilated. - Right ventricle: The cavity size was normal. Systolic function   was normal. - Pulmonary arteries: No complete TR doppler jet so unable to   estimate PA systolic pressure. - Inferior vena cava: The vessel was normal in size. The   respirophasic diameter changes were in the normal range (>= 50%),   consistent with normal central venous pressure.  Impressions:  - Normal LV size with mild LV hypertrophy. EF 55%. Normal RV size   and systolic function. Moderate aortic stenosis. Mild mitral   regurgitation. LAE.  Cardiac Cath 09/22/2017 Conclusions: 1. Severe 3-vessel CAD, as detailed below, with heavy calcification of the coronaries. 2. Diffusely calcified ascending aorta. 3. Moderate aortic stenosis. 4. Mildly elevated left and right heart filling pressures. 5. Normal Fick cardiac  output/index.  Recommendations 1. Gentle post-cath hydration today with holding of furosemide today. Restart furosemide tomorrow, as renal function allows. 2. Continue metoprolol; start ranolazine  500 mg BID. 3. There are no feasable percutaneous revascularization options. Cardiac surgery consultation for CABG/AVR could be considered, though given the patient's age and comorbities, she would be high risk for surgical intervention. Medical therapy may be her best option.  I will hold clopidogrel pending CT surgery consultation.  If surgery is not pursued, clopidogrel should be restarted.  If patient does not undergo cardiac surgery: Recommend uninterrupted dual antiplatelet therapy with Aspirin 81mg  daily and Clopidogrel 75mg  daily for a minimum of 12 months (ACS - Class I recommendation).    Patient Profile     84 y.o. female with a history of mild to moderate AS by last echo 2016 and normal LVF, mildly dilated ascending aorta.  She has not been seen in several years and about 6 weeks ago starting having problems with increased LE edema, DOE, and exertional throat pain.  She denies any actual CP but complains mainly of exertional throat pain. with a history of mild to moderate AS by last echo 2016 and normal LVF, mildly dilated ascending aorta.  She has not been seen in several years and about 6 weeks ago starting having problems with increased LE edema, DOE, and exertional throat pain.  She denies any actual CP but complains mainly of exertional throat pain.   Assessment & Plan    1.  Acute diastolic CHF  -Likely related to combination of moderate aortic stenosis and possible underlying coronary ischemia -EKG showed marked ST depression throughout the precordial and inferior leads.  Troponin also elevated at >2 -LV function normal on echo with only moderate aortic stenosis and therefore suspect this may be ischemia related -She responded well to Lasix 40 mg IV prior to discharge yesterday  and had no SOB and was able to lay flat to sleep -readmitted last night with acute SOB and acute diastolic CHF -given Lasix IV and BIPAP and breathing better -no I&O's recorded -weight 78.3kg on discharge and now 90.8 on admit -breathing much improved and lungs CTA this am on 3L O2 with O2 sats 100% -Creatinine worsened (1.9) from baseline at discharge of 1.62(1.57>1.51>1.62>1.9>1.84) -LVEDP elevated at 22mmHg at cath and sent home on Lasix 20mg  daily -continue Lasix 40mg  IV BID and follow renal function, I&O's and weights closely -Continue ASA 81mg  daily, Plavix 75mg  daily, Toprol XL 50mg  daily, Ranexa 500mg  bid and statin.  -would avoid Imdur with AS  2.  Hypokalemia  -K+ normal at 4.8  3.  NSTEMI/ASCAD -Troponin elevated at 0.54>2.84>2.53>2.52 on admission a few days ago -cath with severe 3v ASCAD - patient not interested in CABG and with advanced age and co morbidities recommend medical managemet -Continue beta-blocker, Plavix 75 mg daily which she is on for history of CVA and aspirin 81 mg daily and Ranexa 500mg  BID -avoid nitrates with AS -increased Simvastatin to 40mg  daily  4.  Moderate aortic stenosis -2D echo this admission showed moderate aortic stenosis with a mean aortic valve gradient of 26 mmHg, aortic valve area 1.2 cm and dimensionless index 0.27 all consistent with moderate aortic stenosis.  Cath confirmed this.  6.  Essential HTN  -BP well controlled at 132/5970mmHg -Continue beta-blocker -ACE on hold due to acute on chronic kidney disease - restart outpt if creatinine normalizes  8.  Hyperlipidemia  -Simvastatin increased to 40mg  daily at discharge yesterday due to LDL of 88 in setting of NSTEMI  I have spent a total of 40 minutes with patient reviewing hospital notes , telemetry, EKGs, labs and examining patient as  well as establishing an assessment and plan that was discussed with the patient.  > 50% of time was spent in direct patient care.    For questions or  updates, please contact [facility] Please consult [email or web address] for contact info under Cardiology/STEMI.      Signed, Armanda Magic, MD  09/24/2017, 12:09 PM

## 2017-09-24 NOTE — ED Triage Notes (Signed)
Pt from independent living facility, woke up unable to breath.  85% on room, placed on cpap increased to 90%.  Sounds diminished lungs.  One albuterol given.

## 2017-09-24 NOTE — Progress Notes (Addendum)
PROGRESS NOTE  Netta CorriganRebecca S Janek GNF:[id number] DOB: 1924/08/12 DOA: 09/24/2017 PCP: Mila PalmerWolters, Sharon, MD  Brief Narrative: 84 year old woman just discharged after sustaining NSTEMI secondary to severe three-vessel coronary artery disease (elected medical management), who developed sudden onset of shortness of breath evening of 8/10.  Presented with acute hypoxic respiratory failure, JVD.  Admitted for hypoxia, acute diastolic CHF, elevated troponin.  Assessment/Plan Acute hypoxic respiratory failure, likely secondary to acute diastolic CHF, differential would include NSTEMI, ACS.  Currently much improved, off BiPAP, asymptomatic, no chest pain. --Wean oxygen as tolerated  Acute on chronic diastolic CHF.  Could have been precipitated by post catheter hydration, however patient was euvolemic on discharge.  Rule out ACS, recurrent NSTEMI. --Continue diuresis.  NSTEMI within the last week severe three-vessel coronary artery disease.  Patient elected medical management. --Point-of-care troponin mildly elevated.  Currently asymptomatic.  Will trend troponin. --Continue aspirin, Plavix, Zocor, ranolazine, metoprolol  AKI, probable CKD stage IV.  AKI considered on previous admission but creatinine remains stable.  Currently creatinine is slightly above baseline and she may have an element of AKI. --Monitor closely with diuresis.  Recheck BMP in a.m.  Moderate aortic stenosis. --Appears stable.  Dilated ascending aorta -Per cardiology last admission "2D echocardiogram did not show any dilatation but once renal function has improved consider chest CT angios for complete evaluationas outpt once renal function improves"   Overall appears complete improved compared to admission by chart review.  Off BiPAP.  Will advance diet.  Continue diuresis.  Trend troponin.  Cardiology consultation for further recommendations and guidance.  DVT prophylaxis: enoxaparin Code Status: DNR Family  Communication: daughter at bedside [facility]    Brendia Sacksaniel Marik Sedore, MD  [facility] Direct contact: [phone] --Via amion app OR  --[email or web address]; password [email or web address]  7PM-7AM contact night coverage as above 09/24/2017, 10:34 AM  LOS: 0 days   Consultants:  Cardiology   Procedures:    Antimicrobials:    Interval history/Subjective: Feels a lot better; breathing better.   Objective: Vitals:  Vitals:   09/24/17 0809 09/24/17 0816  BP: 132/78   Pulse: 81   Resp:    Temp: (!) 97.1 F (36.2 C)   SpO2: 100% 100%    Exam:  Constitutional:  . Appears calm and comfortable off BiPAP Eyes:  . pupils and irises appear normal . Normal lids  ENMT:  . Mildly hard of hearing Respiratory:  . CTA bilaterally, no w/r/r.  . Respiratory effort normal. Cardiovascular:  . RRR, 2/6 holosystolic murmur, no r/g . No LE extremity edema   . Telemetry SR Psychiatric:  . Mental status o Mood, affect appropriate  I have personally reviewed the following:   Labs:  Creatinine 1.84  BNP without significant change on admission 904.6  Troponin point-of-care 0.52  CBC unremarkable  Imaging studies:  Chest x-ray mild interstitial edema with trace left effusion.  Compared to previous study, no significant changes staying, in fact may appear better.  Medical tests:  EKG sinus tachycardia, nonspecific ST abnormalities.  No significant change compared to last test 8/7.  Scheduled Meds: . ALPRAZolam  0.25 mg Oral QHS  . aspirin EC  81 mg Oral Daily  . clopidogrel  75 mg Oral Daily  . enoxaparin (LOVENOX) injection  30 mg Subcutaneous Q24H  . furosemide  40 mg Intravenous BID  . levalbuterol  1.25 mg Nebulization Q6H  . mouth rinse  15 mL Mouth Rinse BID  . metoprolol succinate  50 mg Oral Daily  .  potassium chloride SA  20 mEq Oral Daily  . ranolazine  500 mg Oral BID  . simvastatin  40 mg Oral QHS  . sodium chloride flush  3 mL  Intravenous Q12H   Continuous Infusions: . sodium chloride      Principal Problem:   Acute on chronic diastolic (congestive) heart failure (HCC) Active Problems:   Essential hypertension   Hyperlipidemia   Acute kidney injury (HCC)   Hypertension   NSTEMI (non-ST elevated myocardial infarction) (HCC)   Acute respiratory failure with hypoxia (HCC)   Moderate aortic stenosis   CKD (chronic kidney disease), stage IV (HCC)   LOS: 0 days    Time spent 915- 950, discussion with patient, daughter at bedside, review of records, discussion with cardiology.

## 2017-09-24 NOTE — ED Provider Notes (Signed)
[facility]   CSN: [id number] Arrival date & time: 09/24/17  0113     History   Chief Complaint Chief Complaint  Patient presents with  . Shortness of Breath    HPI Suzanne Howard is a 84 y.o. female.  The history is provided by the EMS personnel. The history is limited by the condition of the patient.  Shortness of Breath  This is a recurrent problem. The problem occurs continuously.The current episode started 3 to 5 hours ago. The problem has not changed since onset.Associated symptoms include wheezing. Pertinent negatives include no fever, no headaches, no coryza, no rhinorrhea and no sputum production. The problem's precipitants include medical treatment. She has tried beta-agonist inhalers for the symptoms. The treatment provided no relief. She has had prior hospitalizations. She has had prior ED visits. Associated medical issues include COPD and CAD.    Past Medical History:  Diagnosis Date  . Aortic stenosis   . CKD (chronic kidney disease)   . DOE (dyspnea on exertion)   . Exertional chest pain   . History of pneumonia    as a child  . Hyperlipidemia   . Hypertension   . Nephrolithiasis   . Stroke (HCC) 1998   no residual effects  . Thoracic spondylosis with myelopathy   . Thoracic stenosis     Patient Active Problem List   Diagnosis Date Noted  . Pressure injury of skin 09/21/2017  . NSTEMI (non-ST elevated myocardial infarction) (HCC)   . Acute kidney injury (HCC) 09/20/2017  . Acute heart failure (HCC) 09/20/2017  . Acute on chronic diastolic (congestive) heart failure (HCC)   . Hypertension   . CKD (chronic kidney disease)   . Aortic stenosis 03/17/2014  . Essential hypertension 03/17/2014  . Hyperlipidemia 03/17/2014    Past Surgical History:  Procedure Laterality Date  . APPENDECTOMY     as a teenager  . CATARACT EXTRACTION, BILATERAL    . ENDOMETRIAL ABLATION    . LITHOTRIPSY  2012    . THORACIC FUSION    . TONSILLECTOMY AND ADENOIDECTOMY       OB History   None      Home Medications    Prior to Admission medications   Medication Sig Start Date End Date Taking? Authorizing Provider  ALPRAZolam (XANAX) 0.25 MG tablet Take 0.25 mg by mouth at bedtime. 08/08/17  Yes [provider]  aspirin EC 81 MG EC tablet Take 1 tablet (81 mg total) by mouth daily. 09/24/17  Yes Standley BrookingGoodrich, Daniel P, MD  clopidogrel (PLAVIX) 75 MG tablet Take 1 tablet (75 mg total) by mouth daily. 12/15/14  Yes Lewayne Buntingrenshaw, Brian S, MD  dextromethorphan-guaiFENesin [facility](MUCINEX DM) 30-600 MG 12hr tablet Take 1 tablet by mouth 2 (two) times daily. Patient taking differently: Take 1 tablet by mouth 2 (two) times daily as needed for cough.  07/04/15  Yes Ofilia Neaslark, Michael L, PA-C  furosemide (LASIX) 20 MG tablet Take 1 tablet (20 mg total) by mouth daily. 09/23/17 09/23/18 Yes Standley BrookingGoodrich, Daniel P, MD  ipratropium (ATROVENT) 0.06 % nasal spray Place 2 sprays into both nostrils 4 (four) times daily. Patient taking differently: Place 2 sprays into both nostrils as needed.  07/04/15  Yes Ofilia Neaslark, Michael L, PA-C  metoprolol succinate (TOPROL-XL) 50 MG 24 hr tablet Take 1 tablet (50 mg total) by mouth daily. Take with or immediately following a meal. 12/15/14  Yes Crenshaw, Madolyn FriezeBrian S, MD  potassium chloride SA (K-DUR,KLOR-CON) 20 MEQ  tablet Take 1 tablet (20 mEq total) by mouth daily. 09/24/17  Yes Standley Brooking, MD  ranolazine (RANEXA) 500 MG 12 hr tablet Take 1 tablet (500 mg total) by mouth 2 (two) times daily. 09/23/17  Yes Standley Brooking, MD  simvastatin (ZOCOR) 40 MG tablet Take 1 tablet (40 mg total) by mouth at bedtime. 09/23/17  Yes Standley Brooking, MD    Family History Family History  Problem Relation Age of Onset  . Heart disease Father   . Anesthesia problems Neg Hx     Social History Social History   Tobacco Use  . Smoking status: Never Smoker  . Smokeless tobacco: Never Used  Substance  Use Topics  . Alcohol use: No    Alcohol/week: 0.0 standard drinks  . Drug use: No     Allergies   Betadine [povidone iodine]   Review of Systems Review of Systems  Unable to perform ROS: Acuity of condition  Constitutional: Negative for diaphoresis and fever.  HENT: Negative for rhinorrhea.   Respiratory: Positive for shortness of breath and wheezing. Negative for sputum production.   Neurological: Negative for headaches.     Physical Exam Updated Vital Signs BP 139/76 (BP Location: Right Arm)   Pulse 99   Temp 97.6 F (36.4 C) (Oral)   Resp (!) 24   SpO2 96%   Physical Exam  Constitutional: She appears well-developed and well-nourished.  HENT:  Head: Normocephalic and atraumatic.  Mouth/Throat: No oropharyngeal exudate.  Eyes: Pupils are equal, round, and reactive to light.  Neck: Normal range of motion. Neck supple. No JVD present.  Cardiovascular: Normal rate, regular rhythm, normal heart sounds and intact distal pulses.  Pulmonary/Chest: Tachypnea noted. She has decreased breath sounds. She exhibits no tenderness.  Abdominal: Soft. Bowel sounds are normal. She exhibits no mass. There is no tenderness. There is no rebound and no guarding.  Musculoskeletal: Normal range of motion.  Neurological: She is alert. She displays normal reflexes.  Skin: Skin is warm and dry. Capillary refill takes less than 2 seconds. She is not diaphoretic.  Psychiatric:  unable     ED Treatments / Results  Labs (all labs ordered are listed, but only abnormal results are displayed) Results for orders placed or performed during the hospital encounter of 09/24/17  CBC with Differential/Platelet  Result Value Ref Range   WBC 5.2 4.0 - 10.5 K/uL   RBC 4.20 3.87 - 5.11 MIL/uL   Hemoglobin 12.3 12.0 - 15.0 g/dL   HCT 09.8 11.9 - 14.7 %   MCV 91.7 78.0 - 100.0 fL   MCH 29.3 26.0 - 34.0 pg   MCHC 31.9 30.0 - 36.0 g/dL   RDW 82.9 56.2 - 13.0 %   Platelets 171 150 - 400 K/uL    Neutrophils Relative % 64 %   Neutro Abs 3.3 1.7 - 7.7 K/uL   Lymphocytes Relative 22 %   Lymphs Abs 1.2 0.7 - 4.0 K/uL   Monocytes Relative 9 %   Monocytes Absolute 0.5 0.1 - 1.0 K/uL   Eosinophils Relative 4 %   Eosinophils Absolute 0.2 0.0 - 0.7 K/uL   Basophils Relative 1 %   Basophils Absolute 0.0 0.0 - 0.1 K/uL   Immature Granulocytes 0 %   Abs Immature Granulocytes 0.0 0.0 - 0.1 K/uL  I-Stat Chem 8, ED  Result Value Ref Range   Sodium 138 135 - 145 mmol/L   Potassium 4.8 3.5 - 5.1 mmol/L   Chloride 105 98 -  111 mmol/L   BUN 35 (H) 8 - 23 mg/dL   Creatinine, Ser 1.61 (H) 0.44 - 1.00 mg/dL   Glucose, Bld 096 (H) 70 - 99 mg/dL   Calcium, Ion 0.45 (L) 1.15 - 1.40 mmol/L   TCO2 24 22 - 32 mmol/L   Hemoglobin 12.6 12.0 - 15.0 g/dL   HCT 40.9 81.1 - 91.4 %  I-stat troponin, ED  Result Value Ref Range   Troponin i, poc 0.52 (HH) 0.00 - 0.08 ng/mL   Comment NOTIFIED PHYSICIAN    Comment 3           Dg Chest Portable 1 View  Result Date: 09/20/2017 CLINICAL DATA:  Subacute onset of respiratory difficulties and diarrhea. Wheezing. EXAM: PORTABLE CHEST 1 VIEW COMPARISON:  Chest radiograph performed 04/29/2011 FINDINGS: The lungs are well-aerated. Vascular congestion is noted. Bibasilar airspace opacities may reflect interstitial edema or possibly pneumonia. No significant pleural effusion or pneumothorax is seen. The cardiomediastinal silhouette is within normal limits. No acute osseous abnormalities are seen. Thoracolumbar spinal fusion hardware is partially imaged. IMPRESSION: Vascular congestion noted. Bibasilar airspace opacities may reflect interstitial edema or possibly pneumonia. Electronically Signed   By: Roanna Raider M.D.   On: 09/20/2017 06:07    EKG EKG Interpretation  Date/Time:  Sunday September 24 2017 01:19:45 EDT Ventricular Rate:  102 PR Interval:    QRS Duration: 121 QT Interval:  387 QTC Calculation: 505 R Axis:   38 Text Interpretation:  Sinus tachycardia  Nonspecific intraventricular conduction delay Nonspecific repol abnormality, diffuse leads Confirmed by Nicanor Alcon, Jorma Tassinari ([id number]) on 09/24/2017 2:13:32 AM   Radiology No results found.  Procedures Procedures (including critical care time)  Medications Ordered in ED Medications  albuterol (PROVENTIL) (2.5 MG/3ML) 0.083% nebulizer solution 5 mg (has no administration in time range)     MDM Reviewed: previous chart and nursing note Reviewed previous: labs and ECG Interpretation: labs, ECG and x-ray (pulmonary edema by me on CXR, elevated troponin but down from previous) Total time providing critical care: 75-105 minutes (bipap initiated by me). This excludes time spent performing separately reportable procedures and services. Consults: admitting MD  CRITICAL CARE Performed by: Jasmine Awe Total critical care time: 75  minutes Critical care time was exclusive of separately billable procedures and treating other patients. Critical care was necessary to treat or prevent imminent or life-threatening deterioration. Critical care was time spent personally by me on the following activities: development of treatment plan with patient and/or surrogate as well as nursing, discussions with consultants, evaluation of patient's response to treatment, examination of patient, obtaining history from patient or surrogate, ordering and performing treatments and interventions, ordering and review of laboratory studies, ordering and review of radiographic studies, pulse oximetry and re-evaluation of patient's condition. Final Clinical Impressions(s) / ED Diagnoses   Will admit for pulmonary edema    Jailynn Lavalais, MD 09/24/17 [id number]

## 2017-09-24 NOTE — ED Notes (Signed)
RN Victorino DikeJennifer informed of troponin results .52. Dr Nicanor AlconPalumbo in peds at this moment.

## 2017-09-24 NOTE — Plan of Care (Signed)
  Problem: Education: Goal: Knowledge of General Education information will improve Description: Including pain rating scale, medication(s)/side effects and non-pharmacologic comfort measures Outcome: Progressing   Problem: Clinical Measurements: Goal: Respiratory complications will improve Outcome: Progressing   Problem: Activity: Goal: Risk for activity intolerance will decrease Outcome: Progressing   

## 2017-09-24 NOTE — ED Notes (Signed)
Dr Nicanor AlconPalumbo informed of troponin results .52

## 2017-09-24 NOTE — Progress Notes (Signed)
RT took patient off BiPAP at 0800 and placed her on 3L [place]. Patient breathing within normal limits with no SOB or tachypnea.

## 2017-09-25 ENCOUNTER — Other Ambulatory Visit: Payer: Self-pay

## 2017-09-25 ENCOUNTER — Telehealth: Payer: Self-pay | Admitting: Physician Assistant

## 2017-09-25 ENCOUNTER — Encounter (HOSPITAL_COMMUNITY): Payer: Self-pay | Admitting: Internal Medicine

## 2017-09-25 DIAGNOSIS — Z515 Encounter for palliative care: Secondary | ICD-10-CM

## 2017-09-25 DIAGNOSIS — Z7189 Other specified counseling: Secondary | ICD-10-CM

## 2017-09-25 DIAGNOSIS — J810 Acute pulmonary edema: Secondary | ICD-10-CM

## 2017-09-25 DIAGNOSIS — I214 Non-ST elevation (NSTEMI) myocardial infarction: Secondary | ICD-10-CM

## 2017-09-25 LAB — BASIC METABOLIC PANEL
Anion gap: 7 (ref 5–15)
BUN: 31 mg/dL — AB (ref 8–23)
CALCIUM: 8.9 mg/dL (ref 8.9–10.3)
CO2: 27 mmol/L (ref 22–32)
CREATININE: 2.17 mg/dL — AB (ref 0.44–1.00)
Chloride: 105 mmol/L (ref 98–111)
GFR, EST AFRICAN AMERICAN: 22 mL/min — AB (ref >60)
GFR, EST NON AFRICAN AMERICAN: 19 mL/min — AB (ref >60)
Glucose, Bld: 98 mg/dL (ref 70–99)
Potassium: 4.5 mmol/L (ref 3.5–5.1)
SODIUM: 139 mmol/L (ref 135–145)

## 2017-09-25 LAB — HEMOGLOBIN A1C
Hgb A1c MFr Bld: 5.2 % (ref 4.8–5.6)
Mean Plasma Glucose: 103 mg/dL

## 2017-09-25 NOTE — Telephone Encounter (Signed)
TOC Patient- Please call Patient- Patient has an appointment on 10-05-17 Suzanne Howard. Pt is still in the hospital as of 09-25-17

## 2017-09-25 NOTE — Progress Notes (Signed)
Pt resting comfortably with no distress on 2lpm cannula.  No BIPAP needed at this time

## 2017-09-25 NOTE — Progress Notes (Addendum)
PROGRESS NOTE  Suzanne Howard UJW:[id number] DOB: Mar 26, 1924 DOA: 09/24/2017 PCP: Mila PalmerWolters, Sharon, MD  Brief Narrative: 84 year old woman just discharged after sustaining NSTEMI secondary to severe three-vessel coronary artery disease (elected medical management), who developed sudden onset of shortness of breath evening of 8/10.  Presented with acute hypoxic respiratory failure, JVD.  Admitted for hypoxia, acute diastolic CHF, elevated troponin.  Assessment/Plan Acute hypoxic respiratory failure, secondary to acute diastolic CHF --Overall improved although still on nasal cannula oxygen.  No decompensation overnight. --Treat heart failure.  Wean oxygen as tolerated.  May need home oxygen.  Acute on chronic diastolic CHF.  Could have been precipitated by post catheter hydration, however patient was euvolemic on discharge.   --Continue aspirin, Plavix, Toprol-XL, Ranexa, statin.  Avoid Imdur with aortic stenosis. --Hold Lasix today given worsening renal function.  NSTEMI within the last week severe three-vessel coronary artery disease.  Patient elected medical management. --Continue Plavix, aspirin, other cardiac medications as above.  AKI, probable CKD stage IV.  AKI considered on previous admission but creatinine remained stable.  --Worsening renal function today from diuresis.  Discontinue Lasix and repeat BMP in a.m.  Fluid status/renal function may be delicate balance.  May need to tolerate higher creatinine.  Moderate aortic stenosis. --.  Follow-up with cardiology as an outpatient.  Dilated ascending aorta -Per cardiology last admission "2D echocardiogram did not show any dilatation but once renal function has improved consider chest CT angios for complete evaluationas outpt once renal function improves"   Overall appears to do be doing well.  Wean oxygen as tolerated.  Hold Lasix today.  Repeat BMP in a.m.  Possible discharge tomorrow.  Will involve palliative medicine to  assist with goals of care.  DVT prophylaxis: enoxaparin Code Status: DNR Family Communication: daughter at bedside 8/12 [facility]    Brendia Sacksaniel Goodrich, MD  [facility] Direct contact: [phone] --Via amion app OR  --[email or web address]; password [email or web address]  7PM-7AM contact night coverage as above 09/25/2017, 12:34 PM  LOS: 1 day   Consultants:  Cardiology   Procedures:    Antimicrobials:    Interval history/Subjective: No problems overnight, feels well, breathing fine.  Objective: Vitals:  Vitals:   09/25/17 0604 09/25/17 0800  BP: 104/60   Pulse: 75 75  Resp: 18   Temp: 98.1 F (36.7 C)   SpO2: 98% 97%    Exam: Constitutional:   . Appears calm and comfortable Respiratory:  . CTA bilaterally, no w/r/r.  . Respiratory effort normal.  Cardiovascular:  . RRR, no m/r/g . No LE extremity edema   . Telemetry SR Psychiatric:  . Mental status o Mood, affect appropriate  I have personally reviewed the following:   Data:  Creatinine trending up, 2.17.  Remainder BMP unremarkable.  Troponin peaked to 1.21.  Last troponin 0 0.95.  Scheduled Meds: . ALPRAZolam  0.25 mg Oral QHS  . aspirin EC  81 mg Oral Daily  . atorvastatin  20 mg Oral q1800  . clopidogrel  75 mg Oral Daily  . enoxaparin (LOVENOX) injection  30 mg Subcutaneous Q24H  . mouth rinse  15 mL Mouth Rinse BID  . metoprolol succinate  50 mg Oral Daily  . potassium chloride SA  20 mEq Oral Daily  . ranolazine  500 mg Oral BID  . sodium chloride flush  3 mL Intravenous Q12H   Continuous Infusions: . sodium chloride      Principal Problem:   Acute on chronic diastolic (congestive) heart  failure [facility](HCC) Active Problems:   Essential hypertension   Hyperlipidemia   Acute kidney injury (HCC)   Hypertension   NSTEMI (non-ST elevated myocardial infarction) (HCC)   Acute respiratory failure with hypoxia (HCC)   Moderate aortic stenosis   CKD (chronic kidney  disease), stage IV (HCC)   CAD (coronary artery disease), native coronary artery   LOS: 1 day    Time spent 35 minutes, greater than 50% in counseling and coordination of care.  Discussion with cardiologist, patient, daughter at bedside.

## 2017-09-25 NOTE — Consult Note (Signed)
Consultation Note Date: 09/25/2017   Patient Name: Suzanne Howard  DOB: 05-01-1924  MRN: [id number]  Age / Sex: 84 y.o., female  PCP: Suzanne Jordan, MD Referring Physician: Samuella Cota, MD  Reason for Consultation: Establishing goals of care   HPI/Patient Profile: 84 y.o. female  with past medical history of aortic stenosis, hypertension, hyperlipidemia, CKD, CVA, and thoracic stenosis admitted on 09/24/2017 with shortness of breath. Recent hospitalization 8/7-8/10 secondary to NSTEMI and CHF. Patient was taken to cath lab on 8/9 and found to have severe three vessel disease with heavily calcified arteries and moderate aortic stenosis. Patient is a high risk surgical candidate and decision has been made to medically manage per cardiology. After discharge on 8/10, patient continued to have shortness of breath and cough. In ED, BNP 904 with tachycardia and tachypnea. Chest xray showed pulmonary edema. BiPAP initiated. Patient now tolerating nasal cannula. Cardiology following. Palliative medicine consultation for goals of care.   Clinical Assessment and Goals of Care:  I have reviewed medical records, discussed with care team, and met with patient and daughter Suzanne Howard) at bedside to discuss diagnosis prognosis, GOC, EOL wishes, disposition and options.  Introduced Palliative Medicine as specialized medical care for people living with serious illness. It focuses on providing relief from the symptoms and stress of a serious illness. The goal is to improve quality of life for both the patient and the family.  We discussed a brief life review of the patient. Suzanne Howard has lived at [facility] independent living since 2015. Her husband of 58 years suffers from dementia and is living in a memory care facility. The patient has three adult children but Suzanne Howard is HCPOA and most involved in her care.   Prior to  hospitalization last week, patient independent of ADL's and using a rolling walker for ambulation. She was even still driving to church up until last week. Good appetite.   Discussed events leading up to hospitalization and course of previous hospitalization. Reviewed cardiology recommendations with patient and daughter for conservative/medical management. Patient and daughter understand she is a high risk candidate for surgical intervention. Educated on chronic, progressive nature of CHF and with underlying severe three vessel disease.   I attempted to elicit values and goals of care important to the patient. Advanced directives and concepts specific to code status were discussed. Patient and daughter tell me they completed a gold DNR form with Dr. Debara Howard this afternoon. Patient confirms her wish against resuscitation. She tells me she is at "peace" and "ready to go" when the time comes. Daughter states "she knows where she is going" Art gallery manager) and speaks of her strong, Christian faith. At this point in the conversation, other family members arrive and wish to visit with the patient.   Daughter, Suzanne Howard, and I further discussed MOST form in family waiting room. The difference between aggressive medical intervention and comfort care was considered in light of the patient's goals of care. Hospice and Palliative Care services outpatient were explained and offered. Suzanne Howard is hopeful  to get her mother back to her home at independent living. Suzanne Howard and a cousin plan to stay with the patient and help her stay as independent as possible. Suzanne Howard is interested in outpatient palliative follow-up.   Suzanne Howard and I plan to further discuss and complete MOST form with patient tomorrow morning, 8/13.  Questions and concerns were addressed.  Hard Choices booklet left for review. PMT contact information given.     SUMMARY OF RECOMMENDATIONS    Good initial palliative discussion with patient and daughter, Suzanne Howard.   Per patient, Suzanne Howard  is documented HCPOA. Requested copy for chart.   Patient and daughter have a good understanding of diagnoses, interventions, and guarded prognosis. They understand she is a high risk surgical candidate and agree with plan to continue conservative medical management for heart conditions.   Patient confirms her decision for DNR in the event of cardiac arrest. Patient had visitors arrive during my visit. Will further discuss and complete MOST form with patient and daughter in AM.   Introduced outpatient palliative and hospice options to daughter. She is interested in outpatient palliative f/u.   PMT will f/u in AM.   Code Status/Advance Care Planning:  DNR  Symptom Management:   Per attending  Palliative Prophylaxis:   Aspiration, Delirium Protocol and Frequent Pain Assessment  Psycho-social/Spiritual:   Desire for further Chaplaincy support:yes  Additional Recommendations: Caregiving  Support/Resources and Education on Hospice  Prognosis:   Unable to determine guarded with acute on chronic diastolic heart failure, severe three vessel disease and high risk surgical candidate/pursuing medical management, and moderate aortic stenosis  Discharge Planning: To Be Determined likely back to ILF with palliative or hospice f/u.      Primary Diagnoses: Present on Admission: . Acute on chronic diastolic (congestive) heart failure ([place]) . Hypertension . NSTEMI (non-ST elevated myocardial infarction) ([place]) . Hyperlipidemia . (Resolved) Acute renal failure superimposed on stage 3 chronic kidney disease ([place]) . Essential hypertension . Acute respiratory failure with hypoxia ([place]) . Acute kidney injury ([place])   I have reviewed the medical record, interviewed the patient and family, and examined the patient. The following aspects are pertinent.  Past Medical History:  Diagnosis Date  . Aortic stenosis   . CKD (chronic kidney disease)   . DOE (dyspnea on exertion)   . Exertional  chest pain   . History of pneumonia    as a child  . Hyperlipidemia   . Hypertension   . Nephrolithiasis   . Stroke ([place]) 1998   no residual effects  . Thoracic spondylosis with myelopathy   . Thoracic stenosis    Social History   Socioeconomic History  . Marital status: Married    Spouse name: Not on file  . Number of children: 3  . Years of education: Not on file  . Highest education level: Not on file  Occupational History  . Not on file  Social Needs  . Financial resource strain: Not on file  . Food insecurity:    Worry: Not on file    Inability: Not on file  . Transportation needs:    Medical: Not on file    Non-medical: Not on file  Tobacco Use  . Smoking status: Never Smoker  . Smokeless tobacco: Never Used  Substance and Sexual Activity  . Alcohol use: No    Alcohol/week: 0.0 standard drinks  . Drug use: No  . Sexual activity: Never    Birth control/protection: Post-menopausal  Lifestyle  . Physical activity:  Days per week: Not on file    Minutes per session: Not on file  . Stress: Not on file  Relationships  . Social connections:    Talks on phone: Not on file    Gets together: Not on file    Attends religious service: Not on file    Active member of club or organization: Not on file    Attends meetings of clubs or organizations: Not on file    Relationship status: Not on file  Other Topics Concern  . Not on file  Social History Narrative  . Not on file   Family History  Problem Relation Age of Onset  . Heart disease Father   . Anesthesia problems Neg Hx    Scheduled Meds: . ALPRAZolam  0.25 mg Oral QHS  . aspirin EC  81 mg Oral Daily  . atorvastatin  20 mg Oral q1800  . clopidogrel  75 mg Oral Daily  . enoxaparin (LOVENOX) injection  30 mg Subcutaneous Q24H  . mouth rinse  15 mL Mouth Rinse BID  . metoprolol succinate  50 mg Oral Daily  . potassium chloride SA  20 mEq Oral Daily  . ranolazine  500 mg Oral BID  . sodium chloride flush   3 mL Intravenous Q12H   Continuous Infusions: . sodium chloride     PRN Meds:.sodium chloride, acetaminophen, dextromethorphan-guaiFENesin, hydrALAZINE, hydrOXYzine, sodium chloride flush, zolpidem Medications Prior to Admission:  Prior to Admission medications   Medication Sig Start Date End Date Taking? Authorizing Provider  ALPRAZolam (XANAX) 0.25 MG tablet Take 0.25 mg by mouth at bedtime. 08/08/17  Yes [provider]  aspirin EC 81 MG EC tablet Take 1 tablet (81 mg total) by mouth daily. 09/24/17  Yes Suzanne Cota, MD  clopidogrel (PLAVIX) 75 MG tablet Take 1 tablet (75 mg total) by mouth daily. 12/15/14  Yes Lelon Perla, MD  dextromethorphan-guaiFENesin [facility] DM) 30-600 MG 12hr tablet Take 1 tablet by mouth 2 (two) times daily. Patient taking differently: Take 1 tablet by mouth 2 (two) times daily as needed for cough.  07/04/15  Yes Tereasa Coop, PA-C  furosemide (LASIX) 20 MG tablet Take 1 tablet (20 mg total) by mouth daily. 09/23/17 09/23/18 Yes Suzanne Cota, MD  ipratropium (ATROVENT) 0.06 % nasal spray Place 2 sprays into both nostrils 4 (four) times daily. Patient taking differently: Place 2 sprays into both nostrils as needed.  07/04/15  Yes Tereasa Coop, PA-C  metoprolol succinate (TOPROL-XL) 50 MG 24 hr tablet Take 1 tablet (50 mg total) by mouth daily. Take with or immediately following a meal. 12/15/14  Yes Crenshaw, Denice Bors, MD  potassium chloride SA (K-DUR,KLOR-CON) 20 MEQ tablet Take 1 tablet (20 mEq total) by mouth daily. 09/24/17  Yes Suzanne Cota, MD  ranolazine (RANEXA) 500 MG 12 hr tablet Take 1 tablet (500 mg total) by mouth 2 (two) times daily. 09/23/17  Yes Suzanne Cota, MD  simvastatin (ZOCOR) 40 MG tablet Take 1 tablet (40 mg total) by mouth at bedtime. 09/23/17  Yes Suzanne Cota, MD   Allergies  Allergen Reactions  . Betadine [Povidone Iodine] Itching    Pt can tolerate shrimp and shellfish   Review of Systems    Constitutional: Positive for activity change and fatigue.  Respiratory: Positive for cough and shortness of breath.   Neurological: Positive for weakness.   Physical Exam  Constitutional: She is oriented to person, place, and time. She is cooperative.  HENT:  Head: Normocephalic and atraumatic.  Cardiovascular: Regular rhythm.  Pulmonary/Chest: No accessory muscle usage. No tachypnea. No respiratory distress.  Comfortable on [place]  Abdominal: Normal appearance. There is no tenderness.  Neurological: She is alert and oriented to person, place, and time.  Skin: Skin is warm and dry.  Psychiatric: She has a normal mood and affect. Her speech is normal and behavior is normal. Cognition and memory are normal.  Nursing note and vitals reviewed.  Vital Signs: BP 105/79 (BP Location: Right Arm)   Pulse 84   Temp 97.8 F (36.6 C) (Oral)   Resp 20   Ht '5\' 3"'  (1.6 m)   Wt 78 kg   SpO2 99%   BMI 30.45 kg/m  Pain Scale: 0-10   Pain Score: 0-No pain   SpO2: SpO2: 99 % O2 Device:SpO2: 99 % O2 Flow Rate: .O2 Flow Rate (L/min): 2 L/min  IO: Intake/output summary:   Intake/Output Summary (Last 24 hours) at 09/25/2017 1617 Last data filed at 09/25/2017 1500 Gross per 24 hour  Intake 730 ml  Output 2650 ml  Net -1920 ml    LBM: Last BM Date: 09/23/17 Baseline Weight: Weight: 80.8 kg Most recent weight: Weight: 78 kg     Palliative Assessment/Data: PPS 50%   Flowsheet Rows     Most Recent Value  Intake Tab  Referral Department  Hospitalist  Unit at Time of Referral  Med/Surg Unit  Palliative Care Primary Diagnosis  Cardiac  Palliative Care Type  New Palliative care  Reason for referral  Clarify Goals of Care  Date first seen by Palliative Care  09/25/17  Clinical Assessment  Palliative Performance Scale Score  50%  Psychosocial & Spiritual Assessment  Palliative Care Outcomes  Patient/Family meeting held?  Yes  Who was at the meeting?  patient and daughter  Palliative Care  Outcomes  Clarified goals of care, Counseled regarding hospice, Provided psychosocial or spiritual support, ACP counseling assistance, Provided end of life care assistance, Linked to palliative care logitudinal support      Time In: 1350 Time Out: 1500 Time Total: 46mn Greater than 50%  of this time was spent counseling and coordinating care related to the above assessment and plan.  Signed by:  MIhor Dow FNP-C Palliative Medicine Team  Phone: [phone]: [phone]  Please contact Palliative Medicine Team phone at [phone] questions and concerns.  For individual provider: See AShea Evans

## 2017-09-25 NOTE — Progress Notes (Signed)
Progress Note  Patient Name: Suzanne CorriganRebecca S Howard Date of Encounter: 09/25/2017  Primary Cardiologist: Olga MillersBrian Crenshaw, MD   Subjective   Diuresed 1400 cc overnight.  Creatinine has risen to 2.17. Troponin is trending down. Plan for medical Tx. Lasix held today.  Inpatient Medications    Scheduled Meds: . ALPRAZolam  0.25 mg Oral QHS  . aspirin EC  81 mg Oral Daily  . atorvastatin  20 mg Oral q1800  . clopidogrel  75 mg Oral Daily  . enoxaparin (LOVENOX) injection  30 mg Subcutaneous Q24H  . mouth rinse  15 mL Mouth Rinse BID  . metoprolol succinate  50 mg Oral Daily  . potassium chloride SA  20 mEq Oral Daily  . ranolazine  500 mg Oral BID  . sodium chloride flush  3 mL Intravenous Q12H   Continuous Infusions: . sodium chloride     PRN Meds: sodium chloride, acetaminophen, dextromethorphan-guaiFENesin, hydrALAZINE, hydrOXYzine, sodium chloride flush, zolpidem   Vital Signs    Vitals:   09/24/17 2040 09/24/17 2326 09/25/17 0604 09/25/17 0800  BP:  108/69 104/60   Pulse:  83 75 75  Resp:  16 18   Temp:  98.1 F (36.7 C) 98.1 F (36.7 C)   TempSrc:  Oral Oral   SpO2: 99% 96% 98% 97%  Weight:   78 kg     Intake/Output Summary (Last 24 hours) at 09/25/2017 1157 Last data filed at 09/25/2017 0900 Gross per 24 hour  Intake 490 ml  Output 1650 ml  Net -1160 ml   Filed Weights   09/24/17 0348 09/25/17 0604  Weight: 80.8 kg 78 kg    Telemetry    Sinus rhythm - Personally Reviewed  ECG   N/A  Physical Exam   General appearance: alert, appears stated age and no distress Neck: no carotid bruit, no JVD and thyroid not enlarged, symmetric, no tenderness/mass/nodules Lungs: clear to auscultation bilaterally Heart: regular rate and rhythm, S1, S2 normal and systolic murmur: systolic ejection 3/6, crescendo at 2nd right intercostal space Abdomen: soft, non-tender; bowel sounds normal; no masses,  no organomegaly Extremities: extremities normal, atraumatic, no  cyanosis or edema Pulses: 2+ and symmetric Skin: Skin color, texture, turgor normal. No rashes or lesions Neurologic: Grossly normal Psych: Pleasant  Labs    Chemistry Recent Labs  Lab 09/20/17 0612  09/22/17 0544 09/23/17 0817 09/24/17 0134 09/24/17 0439 09/25/17 0331  NA 140   < > 140 140 138  --  139  K 3.1*   < > 3.8 4.2 4.8  --  4.5  CL 105   < > 104 106 105  --  105  CO2 22   < > 24 24  --   --  27  GLUCOSE 126*   < > 96 103* 137*  --  98  BUN 24*   < > 26* 21 35*  --  31*  CREATININE 1.57*   < > 1.55* 1.62* 1.90* 1.84* 2.17*  CALCIUM 9.0   < > 8.8* 8.9  --   --  8.9  PROT 6.8  --   --   --   --   --   --   ALBUMIN 3.8  --   --   --   --   --   --   AST 25  --   --   --   --   --   --   ALT 15  --   --   --   --   --   --  ALKPHOS 99  --   --   --   --   --   --   BILITOT 0.8  --   --   --   --   --   --   GFRNONAA 28*   < > 28* 27*  --  23* 19*  GFRAA 32*   < > 33* 31*  --  26* 22*  ANIONGAP 13   < > 12 10  --   --  7   < > = values in this interval not displayed.     Hematology Recent Labs  Lab 09/22/17 0544 09/23/17 0233 09/24/17 0118 09/24/17 0134  WBC 5.7 5.3 5.2  --   RBC 3.74* 3.68* 4.20  --   HGB 11.0* 10.9* 12.3 12.6  HCT 33.7* 33.4* 38.5 37.0  MCV 90.1 90.8 91.7  --   MCH 29.4 29.6 29.3  --   MCHC 32.6 32.6 31.9  --   RDW 14.1 14.4 14.1  --   PLT 157 157 171  --     Cardiac Enzymes Recent Labs  Lab 09/21/17 0205 09/24/17 0921 09/24/17 1521 09/24/17 2127  TROPONINI 2.52* 1.06* 1.21* 0.95*    Recent Labs  Lab 09/20/17 0729 09/24/17 0131  TROPIPOC 0.54* 0.52*     BNP Recent Labs  Lab 09/20/17 0726 09/24/17 0119  BNP 905.4* 904.6*     DDimer No results for input(s): DDIMER in the last 168 hours.   Radiology    Dg Chest Portable 1 View  Result Date: 09/24/2017 CLINICAL DATA:  Difficulty breathing hypoxia on room air. Diminished breath sounds. EXAM: PORTABLE CHEST 1 VIEW COMPARISON:  09/20/2017 FINDINGS: Stable heart size  and mediastinal contours with aortic atherosclerosis and borderline cardiac silhouette size. Mild diffuse interstitial edema is noted with trace left effusion. No acute osseous abnormality is noted. Spinal fusion hardware is noted along the midthoracic spine extending caudad. IMPRESSION: Mild interstitial edema with trace left effusion. Electronically Signed   By: Tollie Eth M.D.   On: 09/24/2017 02:20    Cardiac Studies   2D echo  Study Conclusions  - Left ventricle: The cavity size was normal. Wall thickness was   increased in a pattern of mild LVH. The estimated ejection   fraction was 55%. Although no diagnostic regional wall motion   abnormality was identified, this possibility cannot be completely   excluded on the basis of this study. Doppler parameters are   consistent with abnormal left ventricular relaxation (grade 1   diastolic dysfunction). - Aortic valve: Trileaflet; severely calcified leaflets. There was   moderate stenosis. Mean gradient (S): 29 mm Hg. Peak gradient   (S): 44 mm Hg. Valve area (VTI): 1.2 cm^2. - Mitral valve: Moderately calcified annulus. There was no evidence   for stenosis. There was mild regurgitation. Mean gradient (D): 3   mm Hg. Valve area by pressure half-time: 2.88 cm^2. - Left atrium: The atrium was severely dilated. - Right ventricle: The cavity size was normal. Systolic function   was normal. - Pulmonary arteries: No complete TR doppler jet so unable to   estimate PA systolic pressure. - Inferior vena cava: The vessel was normal in size. The   respirophasic diameter changes were in the normal range (>= 50%),   consistent with normal central venous pressure.  Impressions:  - Normal LV size with mild LV hypertrophy. EF 55%. Normal RV size   and systolic function. Moderate aortic stenosis. Mild mitral   regurgitation. LAE.  Cardiac Cath 09/22/2017 Conclusions: 1. Severe 3-vessel CAD, as detailed below, with heavy calcification of the  coronaries. 2. Diffusely calcified ascending aorta. 3. Moderate aortic stenosis. 4. Mildly elevated left and right heart filling pressures. 5. Normal Fick cardiac output/index.  Recommendations 1. Gentle post-cath hydration today with holding of furosemide today. Restart furosemide tomorrow, as renal function allows. 2. Continue metoprolol; start ranolazine 500 mg BID. 3. There are no feasable percutaneous revascularization options. Cardiac surgery consultation for CABG/AVR could be considered, though given the patient's age and comorbities, she would be high risk for surgical intervention. Medical therapy may be her best option.  I will hold clopidogrel pending CT surgery consultation.  If surgery is not pursued, clopidogrel should be restarted.  If patient does not undergo cardiac surgery: Recommend uninterrupted dual antiplatelet therapy with Aspirin 81mg  daily and Clopidogrel 75mg  daily for a minimum of 12 months (ACS - Class I recommendation).    Patient Profile     84 y.o. female with a history of mild to moderate AS by last echo 2016 and normal LVF, mildly dilated ascending aorta.  She has not been seen in several years and about 6 weeks ago starting having problems with increased LE edema, DOE, and exertional throat pain.  She denies any actual CP but complains mainly of exertional throat pain. with a history of mild to moderate AS by last echo 2016 and normal LVF, mildly dilated ascending aorta.  She has not been seen in several years and about 6 weeks ago starting having problems with increased LE edema, DOE, and exertional throat pain.  She denies any actual CP but complains mainly of exertional throat pain.   Assessment & Plan    1.  Acute diastolic CHF  -Will manage conservatively -Continue ASA 81mg  daily, Plavix 75mg  daily, Toprol XL 50mg  daily, Ranexa 500mg  bid and statin.  -would avoid Imdur with AS - Lasix on hold today for rising creatinine, good diuresis  overnight  2.  NSTEMI/ASCAD -Troponin elevated at 0.54>2.84>2.53>2.52 on admission a few days ago -cath with severe 3v ASCAD - patient not interested in CABG and with advanced age and co morbidities recommend medical managemet -Continue beta-blocker, Plavix 75 mg daily which she is on for history of CVA and aspirin 81 mg daily and Ranexa 500mg  BID -avoid nitrates with AS -increased Simvastatin to 40mg  daily  4.  Moderate aortic stenosis -2D echo this admission showed moderate aortic stenosis with a mean aortic valve gradient of 26 mmHg, aortic valve area 1.2 cm and dimensionless index 0.27 all consistent with moderate aortic stenosis.  Cath confirmed this.  6.  Essential HTN  -BP well controlled at 132/5370mmHg -Continue beta-blocker -ACE on hold due to acute on chronic kidney disease - restart outpt if creatinine normalizes  8.  Hyperlipidemia  -Simvastatin increased to 40mg  daily at discharge yesterday due to LDL of 88 in setting of NSTEMI  She has elected to be DNR - I have filled out the out of hospital MOLST form. Would consider palliative care involvement to help avoid future admissions and focus on patient comfort per her wishes.  Chrystie NoseKenneth C. Mirren Gest, MD, [facility] St LouisFACC, FACP  [place]  [facility]  Medical Director of the Advanced Lipid Disorders &  Cardiovascular Risk Reduction Clinic Diplomate of the American Board of Clinical Lipidology Attending Cardiologist  Direct Dial: [phone]  Fax: [phone]  Website:  [email or web address]  Chrystie NoseKenneth C Ruchi Stoney, MD  09/25/2017, 11:57 AM

## 2017-09-26 DIAGNOSIS — J810 Acute pulmonary edema: Secondary | ICD-10-CM

## 2017-09-26 LAB — BASIC METABOLIC PANEL
Anion gap: 10 (ref 5–15)
BUN: 35 mg/dL — ABNORMAL HIGH (ref 8–23)
CALCIUM: 8.9 mg/dL (ref 8.9–10.3)
CHLORIDE: 101 mmol/L (ref 98–111)
CO2: 26 mmol/L (ref 22–32)
CREATININE: 2.28 mg/dL — AB (ref 0.44–1.00)
GFR, EST AFRICAN AMERICAN: 20 mL/min — AB (ref >60)
GFR, EST NON AFRICAN AMERICAN: 18 mL/min — AB (ref >60)
Glucose, Bld: 89 mg/dL (ref 70–99)
Potassium: 4.4 mmol/L (ref 3.5–5.1)
SODIUM: 137 mmol/L (ref 135–145)

## 2017-09-26 MED ORDER — HEPARIN SODIUM (PORCINE) 5000 UNIT/ML IJ SOLN
5000.0000 [IU] | Freq: Three times a day (TID) | INTRAMUSCULAR | Status: DC
  Administered 2017-09-26: 5000 [IU] via SUBCUTANEOUS
  Filled 2017-09-26: qty 1

## 2017-09-26 NOTE — Telephone Encounter (Signed)
Patient still admitted to hospital currently.

## 2017-09-26 NOTE — Progress Notes (Signed)
Daily Progress Note   Patient Name: Suzanne Howard       Date: 09/26/2017 DOB: 11-26-1924  Age: 84 y.o. MRN#: [id number] Attending Physician: Standley BrookingGoodrich, Daniel P, MD Primary Care Physician: Mila PalmerWolters, Sharon, MD Admit Date: 09/24/2017  Reason for Consultation/Follow-up: Establishing goals of care  Subjective/GOC: Patient awake, alert, oriented. Denies pain or dyspnea. Slept well last night.   Daughter, Suzanne Howard, at bedside. Continued with our conversation from yesterday with plan for medical management and focusing on "quality" of life over "quantity. Discussed and completed MOST form with patient and daughter. Per patient wishes, DNR/DNI, limited interventions including rehospitalization and BiPAP/CPAP if indicated, ABX and IVF if indicated and NO feeding tube. Daughter understands and respects her wishes.   Anne plans to be readily available at her mother's ILF. Also, [place] [name].   Discussed outpatient palliative referral. Explained transition to hospice services and hospice philosophy when patient ready for hospice. Daughter and patient agree with outpatient palliative referral and understand this can transition to hospice when appropriate.  Length of Stay: 2  Current Medications: Scheduled Meds:  . ALPRAZolam  0.25 mg Oral QHS  . aspirin EC  81 mg Oral Daily  . atorvastatin  20 mg Oral q1800  . clopidogrel  75 mg Oral Daily  . heparin injection (subcutaneous)  5,000 Units Subcutaneous Q8H  . mouth rinse  15 mL Mouth Rinse BID  . metoprolol succinate  50 mg Oral Daily  . potassium chloride SA  20 mEq Oral Daily  . ranolazine  500 mg Oral BID  . sodium chloride flush  3 mL Intravenous Q12H    Continuous Infusions: . sodium  chloride      PRN Meds: sodium chloride, acetaminophen, dextromethorphan-guaiFENesin, hydrALAZINE, hydrOXYzine, sodium chloride flush, zolpidem  Physical Exam  Constitutional: She is oriented to person, place, and time. She is cooperative.  HENT:  Head: Normocephalic and atraumatic.  Cardiovascular: Regular rhythm.  Pulmonary/Chest: No accessory muscle usage. No tachypnea. No respiratory distress.  Abdominal: Normal appearance.  Neurological: She is alert and oriented to person, place, and time.  Skin: Skin is warm and dry.  Psychiatric: She has a normal mood and affect. Her speech is normal and behavior is normal. Cognition and memory are normal.  Nursing note and  vitals reviewed.          Vital Signs: BP 111/72 (BP Location: Right Arm)   Pulse 80   Temp 97.9 F (36.6 C) (Oral)   Resp 18   Ht 5\' 3"  (1.6 m)   Wt 77.7 kg   SpO2 98%   BMI 30.33 kg/m  SpO2: SpO2: 98 % O2 Device: O2 Device: Nasal Cannula O2 Flow Rate: O2 Flow Rate (L/min): 1 L/min  Intake/output summary:   Intake/Output Summary (Last 24 hours) at 09/26/2017 0955 Last data filed at 09/26/2017 0700 Gross per 24 hour  Intake 702 ml  Output 1700 ml  Net -998 ml   LBM: Last BM Date: 09/25/17 Baseline Weight: Weight: 80.8 kg Most recent weight: Weight: 77.7 kg       Palliative Assessment/Data:  PPS 50%   Flowsheet Rows     Most Recent Value  Intake Tab  Referral Department  Hospitalist  Unit at Time of Referral  Med/Surg Unit  Palliative Care Primary Diagnosis  Cardiac  Date Notified  09/25/17  Palliative Care Type  New Palliative care  Reason for referral  Clarify Goals of Care  Date of Admission  09/24/17  Date first seen by Palliative Care  09/25/17  # of days Palliative referral response time  0 Day(s)  # of days IP prior to Palliative referral  1  Clinical Assessment  Palliative Performance Scale Score  50%  Psychosocial & Spiritual Assessment  Palliative Care Outcomes  Patient/Family  meeting held?  Yes  Who was at the meeting?  patient and daughter  Palliative Care Outcomes  Clarified goals of care, Counseled regarding hospice, Provided psychosocial or spiritual support, ACP counseling assistance, Provided end of life care assistance, Linked to palliative care logitudinal support      Patient Active Problem List   Diagnosis Date Noted  . Palliative care by specialist   . Goals of care, counseling/discussion   . Acute pulmonary edema (HCC)   . Acute respiratory failure with hypoxia (HCC) 09/24/2017  . Moderate aortic stenosis 09/24/2017  . CKD (chronic kidney disease), stage IV (HCC) 09/24/2017  . CAD (coronary artery disease), native coronary artery 09/24/2017  . Pressure injury of skin 09/21/2017  . NSTEMI (non-ST elevated myocardial infarction) (HCC)   . Acute kidney injury (HCC) 09/20/2017  . Acute on chronic diastolic (congestive) heart failure (HCC)   . Hypertension   . Essential hypertension 03/17/2014  . Hyperlipidemia 03/17/2014    Palliative Care Assessment & Plan   Patient Profile: 84 y.o. female  with past medical history of aortic stenosis, hypertension, hyperlipidemia, CKD, CVA, and thoracic stenosis admitted on 09/24/2017 with shortness of breath. Recent hospitalization 8/7-8/10 secondary to NSTEMI and CHF. Patient was taken to cath lab on 8/9 and found to have severe three vessel disease with heavily calcified arteries and moderate aortic stenosis. Patient is a high risk surgical candidate and decision has been made to medically manage per cardiology. After discharge on 8/10, patient continued to have shortness of breath and cough. In ED, BNP 904 with tachycardia and tachypnea. Chest xray showed pulmonary edema. BiPAP initiated. Patient now tolerating nasal cannula. Cardiology following. Palliative medicine consultation for goals of care.   Assessment: Acute on chronic diastolic CHF NSTEMI Severe three-vessel CAD AKI  Moderate aortic  stenosis Dilated ascending aorta  Recommendations/Plan:  MOST form completed with patient and daughter. DNR/DNI, limited interventions including CPAP/BiPAP if indicated, IVF/ABX if indicated, and NO feeding tube. Durable DNR completed. Copies made for chart and  daughter.   Continue medical management for heart conditions. Cardiology following.  Daughter plans to take her mother back to independent living facility with caregiver support from family.   Patient/daughter interested in outpatient palliative referral. Encouraged daughter to contact patient's PCP for outpatient palliative referral. Patient/daughter understand palliative can transition to hospice services when they are interested.    Goals of Care and Additional Recommendations:  Limitations on Scope of Treatment: No Artificial Feeding, No Surgical Procedures and No Tracheostomy  Code Status: DNR/DNI   Code Status Orders  (From admission, onward)         Start     Ordered   09/24/17 0349  Do not attempt resuscitation (DNR)  Continuous    Question Answer Comment  In the event of cardiac or respiratory ARREST Do not call a "code blue"   In the event of cardiac or respiratory ARREST Do not perform Intubation, CPR, defibrillation or ACLS   In the event of cardiac or respiratory ARREST Use medication by any route, position, wound care, and other measures to relive pain and suffering. May use oxygen, suction and manual treatment of airway obstruction as needed for comfort.      09/24/17 0350        Code Status History    Date Active Date Inactive Code Status Order ID Comments User Context   09/20/2017 1452 09/23/2017 1916 DNR [id number]  Gwenyth BenderBlack, Karen M, NP ED   09/20/2017 1320 09/20/2017 1451 DNR [id number]  Laurann Montanaunn, Dayna N, PA-C ED    Advance Directive Documentation     Most Recent Value  Type of Advance Directive  Healthcare Power of Attorney, Living will  Pre-existing out of facility DNR order (yellow form or pink MOST form)   -  "MOST" Form in Place?  -       Prognosis:   Unable to determine: guarded with acute on chronic diastolic heart failure, severe three vessel disease and high risk for surgical candidate/pursuing medical management, and moderate aortic stenosis  Discharge Planning:  Back to independent living facility with palliative follow-up.   Care plan was discussed with patient and daughter  Thank you for allowing the Palliative Medicine Team to assist in the care of this patient.   Time In: 0930 Time Out: 1005 Total Time 35min Prolonged Time Billed  no      Greater than 50%  of this time was spent counseling and coordinating care related to the above assessment and plan.  Vennie HomansMegan Irwin Toran, FNP-C Palliative Medicine Team  Phone: [phone] Fax: [phone]  Please contact Palliative Medicine Team phone at [phone] for questions and concerns.

## 2017-09-26 NOTE — Progress Notes (Signed)
   09/26/17 1100  Clinical Encounter Type  Visited With Patient;Patient and family together  Visit Type Initial  Referral From Chaplain  Consult/Referral To Chaplain  Spiritual Encounters  Spiritual Needs Emotional  Stress Factors  Patient Stress Factors None identified    This was a referral visit by a previous chaplain. Pt was being helped by NT. Chaplain had a meaningful conversation with Pt and pt's daughters who were present. Both were very receptive and appreciative of chaplain's visit.  Tayton Decaire a Water quality scientistMusiko-Holley, E. I. du PontChaplain

## 2017-09-26 NOTE — Progress Notes (Signed)
Progress Note  Patient Name: Suzanne CorriganRebecca S Troupe Date of Encounter: 09/26/2017  Primary Cardiologist: Olga MillersBrian Crenshaw, MD   Subjective   Pt feels well today. Does not appear volume overloaded, but requiring supplemental oxygen.  Inpatient Medications    Scheduled Meds: . ALPRAZolam  0.25 mg Oral QHS  . aspirin EC  81 mg Oral Daily  . atorvastatin  20 mg Oral q1800  . clopidogrel  75 mg Oral Daily  . enoxaparin (LOVENOX) injection  30 mg Subcutaneous Q24H  . mouth rinse  15 mL Mouth Rinse BID  . metoprolol succinate  50 mg Oral Daily  . potassium chloride SA  20 mEq Oral Daily  . ranolazine  500 mg Oral BID  . sodium chloride flush  3 mL Intravenous Q12H   Continuous Infusions: . sodium chloride     PRN Meds: sodium chloride, acetaminophen, dextromethorphan-guaiFENesin, hydrALAZINE, hydrOXYzine, sodium chloride flush, zolpidem   Vital Signs    Vitals:   09/25/17 1735 09/25/17 1749 09/25/17 2146 09/26/17 0650  BP:   124/67 111/72  Pulse: 96 92 (!) 3 80  Resp:   18   Temp:   98 F (36.7 C) 97.9 F (36.6 C)  TempSrc:   Oral Oral  SpO2: 97% 98% 97% 98%  Weight:    77.7 kg  Height:        Intake/Output Summary (Last 24 hours) at 09/26/2017 0905 Last data filed at 09/26/2017 0700 Gross per 24 hour  Intake 702 ml  Output 1700 ml  Net -998 ml   Filed Weights   09/25/17 0604 09/25/17 1528 09/26/17 0650  Weight: 78 kg 78 kg 77.7 kg    Telemetry    Sinus rhythm - Personally Reviewed  ECG    No new tracings - Personally Reviewed  Physical Exam   GEN: No acute distress.   Neck: No JVD Cardiac: RRR, no murmurs, rubs, or gallops.  Respiratory: respirations unlabored, diminished in bases GI: Soft, nontender, non-distended  MS: No edema; No deformity. Right radial access site C/D/I Neuro:  Nonfocal  Psych: Normal affect   Labs    Chemistry Recent Labs  Lab 09/20/17 0612  09/23/17 0817 09/24/17 0134 09/24/17 0439 09/25/17 0331 09/26/17 0428  NA  140   < > 140 138  --  139 137  K 3.1*   < > 4.2 4.8  --  4.5 4.4  CL 105   < > 106 105  --  105 101  CO2 22   < > 24  --   --  27 26  GLUCOSE 126*   < > 103* 137*  --  98 89  BUN 24*   < > 21 35*  --  31* 35*  CREATININE 1.57*   < > 1.62* 1.90* 1.84* 2.17* 2.28*  CALCIUM 9.0   < > 8.9  --   --  8.9 8.9  PROT 6.8  --   --   --   --   --   --   ALBUMIN 3.8  --   --   --   --   --   --   AST 25  --   --   --   --   --   --   ALT 15  --   --   --   --   --   --   ALKPHOS 99  --   --   --   --   --   --  BILITOT 0.8  --   --   --   --   --   --   GFRNONAA 28*   < > 27*  --  23* 19* 18*  GFRAA 32*   < > 31*  --  26* 22* 20*  ANIONGAP 13   < > 10  --   --  7 10   < > = values in this interval not displayed.     Hematology Recent Labs  Lab 09/22/17 0544 09/23/17 0233 09/24/17 0118 09/24/17 0134  WBC 5.7 5.3 5.2  --   RBC 3.74* 3.68* 4.20  --   HGB 11.0* 10.9* 12.3 12.6  HCT 33.7* 33.4* 38.5 37.0  MCV 90.1 90.8 91.7  --   MCH 29.4 29.6 29.3  --   MCHC 32.6 32.6 31.9  --   RDW 14.1 14.4 14.1  --   PLT 157 157 171  --     Cardiac Enzymes Recent Labs  Lab 09/21/17 0205 09/24/17 0921 09/24/17 1521 09/24/17 2127  TROPONINI 2.52* 1.06* 1.21* 0.95*    Recent Labs  Lab 09/20/17 0729 09/24/17 0131  TROPIPOC 0.54* 0.52*     BNP Recent Labs  Lab 09/20/17 0726 09/24/17 0119  BNP 905.4* 904.6*     DDimer No results for input(s): DDIMER in the last 168 hours.   Radiology    No results found.  Cardiac Studies   2D echo  Study Conclusions  - Left ventricle: The cavity size was normal. Wall thickness was increased in a pattern of mild LVH. The estimated ejection fraction was 55%. Although no diagnostic regional wall motion abnormality was identified, this possibility cannot be completely excluded on the basis of this study. Doppler parameters are consistent with abnormal left ventricular relaxation (grade 1 diastolic dysfunction). - Aortic valve:  Trileaflet; severely calcified leaflets. There was moderate stenosis. Mean gradient (S): 29 mm Hg. Peak gradient (S): 44 mm Hg. Valve area (VTI): 1.2 cm^2. - Mitral valve: Moderately calcified annulus. There was no evidence for stenosis. There was mild regurgitation. Mean gradient (D): 3 mm Hg. Valve area by pressure half-time: 2.88 cm^2. - Left atrium: The atrium was severely dilated. - Right ventricle: The cavity size was normal. Systolic function was normal. - Pulmonary arteries: No complete TR doppler jet so unable to estimate PA systolic pressure. - Inferior vena cava: The vessel was normal in size. The respirophasic diameter changes were in the normal range (>= 50%), consistent with normal central venous pressure.  Impressions: - Normal LV size with mild LV hypertrophy. EF 55%. Normal RV size and systolic function. Moderate aortic stenosis. Mild mitral regurgitation. LAE.   Cardiac Cath 09/22/2017 Conclusions: 1. Severe 3-vessel CAD, as detailed below, with heavy calcification of the coronaries. 2. Diffusely calcified ascending aorta. 3. Moderate aortic stenosis. 4. Mildly elevated left and right heart filling pressures. 5. Normal Fick cardiac output/index.  Recommendations 1. Gentle post-cath hydration today with holding of furosemide today. Restart furosemide tomorrow, as renal function allows. 2. Continue metoprolol; start ranolazine 500 mg BID. 3. There are no feasable percutaneous revascularization options. Cardiac surgery consultation for CABG/AVR could be considered, though given the patient's age and comorbities, she would be high risk for surgical intervention. Medical therapy may be her best option. I will hold clopidogrel pending CT surgery consultation. If surgery is not pursued, clopidogrel should be restarted.  If patient does not undergo cardiac surgery: Recommend uninterrupted dual antiplatelet therapy with Aspirin 81mg  daily and  Clopidogrel 75mg  dailyfor a  minimum of 12 months (ACS - Class I recommendation).  Patient Profile     84 y.o. female with a history of mild to moderate AS by last echo 2016 and normal LVF, mildly dilated ascending aorta. She has not been seen in several years and about 6 weeks ago starting having problems with increased LE edema, DOE, and exertional throat pain. She denies any actual CP but complains mainly of exertional throat pain. with a history of mild to moderate AS by last echo 2016 and normal LVF, mildly dilated ascending aorta. She has not been seen in several years and about 6 weeks ago starting having problems with increased LE edema, DOE, and exertional throat pain. She denies any actual CP but complains mainly of exertional throat pain.  Assessment & Plan    1. NSTEMI, ASCAD - troponin 0.54 --> 2.84 --> 2.53 --> 2.52 - heart cath with severe 3 vessel disease - she is not interested in CABG, will proceed with medical management - palliative on board - will continue ASA and plavix x 12 months, ranexa added, continue beta blocker - will avoid nitrates with severe AS - she denies chest pain today   2. HLD - 09/24/2017: Cholesterol 179; HDL 58; LDL Cholesterol 88; Triglycerides 163; VLDL 33 - LDL goal less than 70, continue statin   3. Acute diastolic heart failure 4. Acute kidney injury - medications as above, lasix held yesterday for renal function - sCr today 2.28 (2.17) - will hold lasix again today - pt does not appear extremely overloaded on exam - she is overall net negative 2L with 1700 cc urine output yesterday - weight is 171 lbs, from 178 lbs on admission - will change lovenox to heparin SQ for PPX - we discussed daily weights   5. Hypoxia - pt on supplemental oxygen - will ask nursing to collect ambulatory O2 sats to see if she qualifies for home oxygen - will need to be set up prior to discharge   6. HTN - pressures well-controlled on current  regimen - no medication changes, continue to hold ACEI   7. HLD - 09/24/2017: Cholesterol 179; HDL 58; LDL Cholesterol 88; Triglycerides 163; VLDL 33 - continue simvastatin 40 mg   For questions or updates, please contact [facility] Please consult [email or web address] for contact info under Cardiology/STEMI.      Signed, Roe Rutherfordngela Nicole Duke, PA  09/26/2017, 9:05 AM

## 2017-09-26 NOTE — Discharge Summary (Signed)
Physician Discharge Summary  Suzanne Howard ZOX:[id number] DOB: 02-May-1924 DOA: 09/24/2017  PCP: Mila Palmer, MD  Admit date: 09/24/2017 Discharge date: 09/26/2017  Recommendations for Outpatient Follow-up:   AKI, probable CKD stage IV.  AKI considered on previous admission but creatinine remained stable.  --Appears to be peaking, secondary to aggressive diuresis.  Expect spontaneous recovery.  Continue to hold Lasix and recheck bloodwork Friday. Recommend following daily weights at home and contact cardiology if weight goes up >2 lbs in 2-3 days. --resume potassium when Lasix resumed   Follow-up Information    Marcelino Duster, PA Follow up on 10/05/2017.   Specialties:  Physician Assistant, Cardiology, Radiology Why:  9:30 AM Contact information: [address] [place] [address] [phone]        [facility] Follow up on 09/29/2017.   Specialty:  Cardiology Why:  Go for bloodwork between 8 AM and 11 AM. No appointment needed. Contact information: [address] [place] [address] [phone]              Discharge Diagnoses:  1. Acute hypoxic respiratory failure, secondary to acute diastolic CHF 2. Acute on chronic diastolic CHF 3. Demand ischemia secondary to CHF. 4. NSTEMI within the last week severe three-vessel coronary artery disease 5. AKI, probable CKD stage IV 6. Moderate aortic stenosis.  Discharge Condition: improved Disposition: home/[facility]  Diet recommendation: heart healthy  Filed Weights   09/25/17 0604 09/25/17 1528 09/26/17 0650  Weight: 78 kg 78 kg 77.7 kg    History of present illness:  84 year old woman just discharged after sustaining NSTEMI secondary to severe three-vessel coronary artery disease (elected medical management), who developed sudden onset of shortness of breath evening of 8/10.  Presented with acute hypoxic respiratory failure, JVD.  Admitted for  hypoxia, acute diastolic CHF, elevated troponin.  Hospital Course:  Patient was treated with Lasix with rapid clinical improvement. Did not need BiPAP again.  Renal function did worsen and Lasix was held.  She had mildly elevated troponin, demand ischemia secondary to acute CHF.   She will be observed off Lasix for the next 72 hours with repeat blood work done Friday.  She will follow-up with cardiology next week.  Acute hypoxic respiratory failure, secondary to acute diastolic CHF --Respiratory status remains stable, has not required BiPAP.  --hypoxia resolved. Walked in hallway, oxygenation on RA acceptable.  Acute on chronic diastolic CHF.  Could have been precipitated by post catheter hydration, however patient was euvolemic on discharge.   --Appears euvolemic.  Continue aspirin, Plavix, Toprol-XL, Ranexa, statin.  Avoid Imdur with aortic stenosis. --Hold Lasix until Friday blood work and further recommendations from cardiology.  Demand ischemia secondary to CHF. --No further evaluation recommended.  NSTEMI within the last week severe three-vessel coronary artery disease.  Patient elected medical management. --Continue Plavix, aspirin, other cardiac medications as above.  AKI, probable CKD stage IV.  AKI considered on previous admission but creatinine remained stable.  --Appears to be peaking, secondary to aggressive diuresis.  Expect spontaneous recovery.  Lasix on hold until follow-up.  Discussed with Dr. Rennis Golden today who concurs with discharge.  Moderate aortic stenosis. --.  Follow-up with cardiology as an outpatient.  Dilated ascending aorta -Per cardiology last admission "2D echocardiogram did not show any dilatation but once renal function has improved consider chest CT angios for complete evaluationas outpt once renal function improves"  Consultants: Cardiology   Today's assessment: S: feeling well, no SOB, no CP. Slept  well. O: Vitals:  Vitals:   09/26/17 1000  09/26/17 1030  BP: 115/60   Pulse: 80   Resp:    Temp:    SpO2: 97% 98%    Constitutional:  . Appears calm and comfortable Respiratory:  . CTA bilaterally, no w/r/r.  . Respiratory effort normal. Cardiovascular:  . RRR, 2/6 holosystolic murmur, no r/g . No LE extremity edema    telemetry SR Psychiatric:  . Mental status o Mood, affect appropriate  Creatinine noted, 2.28.  BUN noted, 3 5.  Potassium within normal limits.  Discharge Instructions  Discharge Instructions    Diet - low sodium heart healthy   Complete by:  As directed    Discharge instructions   Complete by:  As directed    Call your physician or seek immediate medical attention for chest pain, shortness of breath, swelling, weight gain or worsening of condition. Continue to hold Lasix and recheck bloodwork Friday. Recommend following daily weights at home and contact cardiology if weight goes up >2 lbs in 2-3 days.   Heart Failure patients record your daily weight using the same scale at the same time of day   Complete by:  As directed    Increase activity slowly   Complete by:  As directed      Allergies as of 09/26/2017      Reactions   Betadine [povidone Iodine] Itching   Pt can tolerate shrimp and shellfish      Medication List    STOP taking these medications   furosemide 20 MG tablet Commonly known as:  LASIX   potassium chloride SA 20 MEQ tablet Commonly known as:  K-DUR,KLOR-CON     TAKE these medications   ALPRAZolam 0.25 MG tablet Commonly known as:  XANAX Take 0.25 mg by mouth at bedtime.   aspirin 81 MG EC tablet Take 1 tablet (81 mg total) by mouth daily.   clopidogrel 75 MG tablet Commonly known as:  PLAVIX Take 1 tablet (75 mg total) by mouth daily.   dextromethorphan-guaiFENesin 30-600 MG 12hr tablet Commonly known as:  MUCINEX DM Take 1 tablet by mouth 2 (two) times daily. What changed:    when to take this  reasons to take this   ipratropium 0.06 % nasal  spray Commonly known as:  ATROVENT Place 2 sprays into both nostrils 4 (four) times daily. What changed:    when to take this  reasons to take this   metoprolol succinate 50 MG 24 hr tablet Commonly known as:  TOPROL-XL Take 1 tablet (50 mg total) by mouth daily. Take with or immediately following a meal.   ranolazine 500 MG 12 hr tablet Commonly known as:  RANEXA Take 1 tablet (500 mg total) by mouth 2 (two) times daily.   simvastatin 40 MG tablet Commonly known as:  ZOCOR Take 1 tablet (40 mg total) by mouth at bedtime.      Allergies  Allergen Reactions  . Betadine [Povidone Iodine] Itching    Pt can tolerate shrimp and shellfish    The results of significant diagnostics from this hospitalization (including imaging, microbiology, ancillary and laboratory) are listed below for reference.    Significant Diagnostic Studies: Dg Chest Portable 1 View  Result Date: 09/24/2017 CLINICAL DATA:  Difficulty breathing hypoxia on room air. Diminished breath sounds. EXAM: PORTABLE CHEST 1 VIEW COMPARISON:  09/20/2017 FINDINGS: Stable heart size and mediastinal contours with aortic atherosclerosis and borderline cardiac silhouette size. Mild diffuse interstitial edema is noted with trace  left effusion. No acute osseous abnormality is noted. Spinal fusion hardware is noted along the midthoracic spine extending caudad. IMPRESSION: Mild interstitial edema with trace left effusion. Electronically Signed   By: Tollie Ethavid  Kwon M.D.   On: 09/24/2017 02:20   Microbiology: Recent Results (from the past 240 hour(s))  MRSA PCR Screening     Status: None   Collection Time: 09/20/17  6:59 PM  Result Value Ref Range Status   MRSA by PCR NEGATIVE NEGATIVE Final    Comment:        The GeneXpert MRSA Assay (FDA approved for NASAL specimens only), is one component of a comprehensive MRSA colonization surveillance program. It is not intended to diagnose MRSA infection nor to guide or monitor treatment  for MRSA infections. Performed at [facility], [address] [place], [place] [address]   MRSA PCR Screening     Status: None   Collection Time: 09/24/17  6:09 AM  Result Value Ref Range Status   MRSA by PCR NEGATIVE NEGATIVE Final    Comment:        The GeneXpert MRSA Assay (FDA approved for NASAL specimens only), is one component of a comprehensive MRSA colonization surveillance program. It is not intended to diagnose MRSA infection nor to guide or monitor treatment for MRSA infections. Performed at [facility], [address] [place], [place] [address]      Labs: Basic Metabolic Panel: Recent Labs  Lab 09/20/17 0612 09/21/17 0205 09/22/17 [id number] 09/23/17 [id number] 09/24/17 0134 09/24/17 0439 09/25/17 0331 09/26/17 0428  NA 140 141 140 140 138  --  139 137  K 3.1* 3.4* 3.8 4.2 4.8  --  4.5 4.4  CL 105 106 104 106 105  --  105 101  CO2 22 24 24 24   --   --  27 26  GLUCOSE 126* 99 96 103* 137*  --  98 89  BUN 24* 22 26* 21 35*  --  31* 35*  CREATININE 1.57* 1.51* 1.55* 1.62* 1.90* 1.84* 2.17* 2.28*  CALCIUM 9.0 8.6* 8.8* 8.9  --   --  8.9 8.9  MG 1.8  --   --   --   --   --   --   --    Liver Function Tests: Recent Labs  Lab 09/20/17 0612  AST 25  ALT 15  ALKPHOS 99  BILITOT 0.8  PROT 6.8  ALBUMIN 3.8   CBC: Recent Labs  Lab 09/20/17 0612 09/21/17 0205 09/22/17 0544 09/23/17 0233 09/24/17 0118 09/24/17 0134  WBC 9.2 5.3 5.7 5.3 5.2  --   NEUTROABS  --   --   --   --  3.3  --   HGB 12.7 11.0* 11.0* 10.9* 12.3 12.6  HCT 39.6 33.2* 33.7* 33.4* 38.5 37.0  MCV 91.0 89.5 90.1 90.8 91.7  --   PLT 178 153 157 157 171  --    Cardiac Enzymes: Recent Labs  Lab 09/20/17 2315 09/21/17 0205 09/24/17 0921 09/24/17 1521 09/24/17 2127  TROPONINI 2.53* 2.52* 1.06* 1.21* 0.95*    Recent Labs    09/20/17 0726 09/24/17 0119  BNP 905.4* 904.6*    Principal Problem:   Acute on chronic diastolic (congestive) heart failure (HCC) Active  Problems:   Essential hypertension   Hyperlipidemia   Acute kidney injury (HCC)   Hypertension   NSTEMI (non-ST elevated myocardial infarction) (HCC)   Acute respiratory failure with hypoxia (HCC)   Moderate aortic stenosis  CKD (chronic kidney disease), stage IV (HCC)   CAD (coronary artery disease), native coronary artery   Palliative care by specialist   Goals of care, counseling/discussion   Acute pulmonary edema (HCC)   Time coordinating discharge: 35 minutes  Signed:  Brendia Sacks, MD [facility] 09/26/2017, 11:11 AM

## 2017-09-26 NOTE — Progress Notes (Signed)
Patient ambulated in room, tolerated well. Oxygen saturation maintained above 98%.  Barrington Ellisonlivia Islay Polanco, RN

## 2017-09-26 NOTE — Care Management Note (Signed)
Case Management Note  Patient Details  Name: Suzanne Howard MRN: [id number] Date of Birth: 1924/05/17  Subjective/Objective:  Pt presented for Acute on Chronic Heart Failure - [facility]TA [facility]                 Action/Plan: [facility]. Daughter to provide transportation. Per pt she is independent and will not need any HH Services at this time. Patient is aware that if she needs services in the future to contact her PCP. No further needs from CM at this time.   Expected Discharge Date:  09/26/17               Expected Discharge Plan:  ([facility].   In-House Referral:  NA  Discharge planning Services  CM Consult  Post Acute Care Choice:  NA Choice offered to:  NA  DME Arranged:  N/A DME Agency:  NA  HH Arranged:  NA HH Agency:  NA  Status of Service:  Completed, signed off  If discussed at Long Length of Stay Meetings, dates discussed:    Additional Comments:  Gala LewandowskyGraves-Bigelow, [place] Winslow Kaye, RN 09/26/2017, 12:03 PM

## 2017-09-26 NOTE — Progress Notes (Signed)
Discharge order obtained.  IV removed intact, telemetry monitor removed.  Reviewed AVS with patient/family, including medications, activity/restrictions, follow-up appointments.  Patient and family verbalized understanding.  Questions asked and answered.  Belongings given to family/patient. Patient transported out in wheelchair.   Barrington Ellisonlivia Seon Gaertner, RN

## 2017-09-27 ENCOUNTER — Other Ambulatory Visit (HOSPITAL_COMMUNITY): Payer: Medicare Other

## 2017-09-27 NOTE — Telephone Encounter (Signed)
Attempted to contact patient, no answer. Will try again.

## 2017-09-27 NOTE — Telephone Encounter (Signed)
Called patient, LVM to call back to discuss upcoming appointment. Left call back number.

## 2017-09-28 NOTE — Telephone Encounter (Signed)
Patient [facility].  Patient understands to follow up with provider Micah FlesherAngela Duke, on 10/05/17 at 9:30am [facility]. Patient understands discharge instructions? Yes Patient understands medications and regiment? Yes Patient understands to bring all medications to this visit? Yes

## 2017-09-29 ENCOUNTER — Other Ambulatory Visit: Payer: Self-pay

## 2017-09-29 DIAGNOSIS — N289 Disorder of kidney and ureter, unspecified: Secondary | ICD-10-CM

## 2017-09-30 LAB — BASIC METABOLIC PANEL
BUN/Creatinine Ratio: 19 (ref 12–28)
BUN: 40 mg/dL — AB (ref 10–36)
CALCIUM: 9.2 mg/dL (ref 8.7–10.3)
CHLORIDE: 100 mmol/L (ref 96–106)
CO2: 20 mmol/L (ref 20–29)
Creatinine, Ser: 2.11 mg/dL — ABNORMAL HIGH (ref 0.57–1.00)
GFR calc Af Amer: 23 mL/min/{1.73_m2} — ABNORMAL LOW (ref >59)
GFR, EST NON AFRICAN AMERICAN: 20 mL/min/{1.73_m2} — AB (ref >59)
Glucose: 115 mg/dL — ABNORMAL HIGH (ref 65–99)
POTASSIUM: 5 mmol/L (ref 3.5–5.2)
Sodium: 137 mmol/L (ref 134–144)

## 2017-10-02 ENCOUNTER — Telehealth: Payer: Self-pay | Admitting: Physician Assistant

## 2017-10-02 NOTE — Telephone Encounter (Signed)
New Message        Patient's daughter called and is asking for medicine to help her mother sleep, she also has anxiety and Chest pain. Patient's daughter states her mother is declining each day. Also the daughter says the pain is going up into the her mother's neck. Patient needs something to help her be comfortable if possible. If oked they would like something sent [facility] to [facility] [employer].

## 2017-10-02 NOTE — Telephone Encounter (Signed)
Spoke with pt's daughter. Pt's daughter sts that the pt has been experiencing anxiety and generalized pain for several days. The pt has been unable to sleep, she cannot or lay down without feeling like she cannot catch her breath. She is having intermittent chest pain that radiates out to her neck. She is also having generalized pain. Her weight is down. She has not weighed this morning. Pt denies cough, fever, chills,headache, nausea. They think she is having some reflex and has had some regurgitation.  There has been a sudden death in the family, which might be contributing to the pt 's anxiety.Her daughter Thurston Holenne says that she has been up with the pt several times at night the last couple of days. This morning at 4am they had to call the assisted living nurse, who adv the pt she did not think she needs to go to the hospital because " there is nothing they are going to do for her that cannot be done at home" so she should call her doctor. Pt does not want to go to the ED. She has been recently hospitalized for pulmonary edema, she had a cardiac cath on 8/ 9 with Dr.End. Thurston Holenne feels that her Moms symptoms are related to anxiety.  Her daughter Thurston Holenne is requesting prescriptions for pain, anxiety, and something for sleep. Adv her that those prescriptions would need to be written by the pt primary care physician. recommended  the pt f/u with her pcp regarding anxiety, generalized pain, and not sleeping. Confirmed that the pt is taking Ranexa 500mg  bid. Pt does have an appt with Micah FlesherAngela Duke, PA on 8/22. Thurston Holenne is also requesting the results for labs drawn on 8/16, adv her that they have not yet been reviewed by the provider, I will sen Angie a message to review and comment on the patient's labs.  Clotilde DieterAdv Anne that I will fwd an update to Angie and we will give them a f/u call if Angie has any additional recommendations and has reviewed the pt's lab results.  Thurston Holenne ask that when we call back we call the home # listed  for the pt.  Message fwd to Angie.

## 2017-10-03 NOTE — Progress Notes (Signed)
Cardiology Office Note:    Date:  10/05/2017   ID:  Suzanne Howard, DOB 1924-12-03, MRN [id number]  PCP:  Mila Palmer, MD  Cardiologist:  Olga Millers, MD   Referring MD: Mila Palmer, MD   Chief Complaint  Patient presents with  . Hospitalization Follow-up    shortness of breath    History of Present Illness:    Suzanne Howard is a 84 y.o. female with a hx of moderate aortic stenosis, mildly dilated ascending aorta, left and right heart cath with severe multivessel disease, hypertension, hyperlipidemia, nephrolithiasis stroke, thoracic stenosis, and probable CKD stage III.  She was recently hospitalized with exertional throat pain.  Echocardiogram with LVEF of 55%, no regional wall motion abnormality grade 1 diastolic dysfunction, moderate aortic stenosis, left atrial enlargement, and mild mitral regurgitation.  Given her symptoms, she underwent right and left heart catheterization on 09/22/2017.  She was found to have multivessel disease, but no revascularization options.  CT surgery evaluation was entertained; however, the patient is not interested in pursuing surgical options.  She opted for medical management and palliative care.  Plavix was resumed, Ranexa was added and she was continued on a beta-blocker.  Nitrates were avoided given her aortic stenosis.  She suffered acute kidney injury while hospitalized with a discharge creatinine of 2.28.  Renal function improved when rechecked on 09/29/2017.  She was instructed to continue holding Lasix at that time.  Patient's daughter called on 10/02/2017 stating that her mother has been experiencing generalized anxiety and pain.  She requested medicine for anxiety, pain, and sleep.  She reportedly called the assisted living nurse who told them they showed him to go to the hospital because "there is nothing they are going to do for her." She has since seen her PCP who prescribed tizanidine and lorazepam.  She presents back to the  office today for post hospital follow-up.  She is here with three family members. The family has suffered a recent loss (son-in-law committed suicide). The patient's husband is also in assisted living for demential. There is a lot of stress within the family. The patient states she is feeling weak and is not eating and drinking. She complains of shortness of breath, no chest pain. She is not eating or drinking and states her urine is dark. The family requests a hospice referral. She was seen by Palliative Care in the hospital.  Past Medical History:  Diagnosis Date  . Aortic stenosis   . CKD (chronic kidney disease)   . DOE (dyspnea on exertion)   . Exertional chest pain   . History of pneumonia    as a child  . Hyperlipidemia   . Hypertension   . Nephrolithiasis   . Stroke (HCC) 1998   no residual effects  . Thoracic spondylosis with myelopathy   . Thoracic stenosis     Past Surgical History:  Procedure Laterality Date  . APPENDECTOMY     as a teenager  . CATARACT EXTRACTION, BILATERAL    . ENDOMETRIAL ABLATION    . LITHOTRIPSY  2012  . RIGHT/LEFT HEART CATH AND CORONARY ANGIOGRAPHY N/A 09/22/2017   Procedure: RIGHT/LEFT HEART CATH AND CORONARY ANGIOGRAPHY;  Surgeon: Yvonne Kendall, MD;  Location: [facility];  Service: Cardiovascular;  Laterality: N/A;  . THORACIC FUSION    . TONSILLECTOMY AND ADENOIDECTOMY      Current Medications: Current Meds  Medication Sig  . aspirin EC 81 MG EC tablet Take 1 tablet (81 mg total) by  mouth daily.  Marland Kitchen. ipratropium (ATROVENT) 0.06 % nasal spray Place 2 sprays into both nostrils 4 (four) times daily. (Patient taking differently: Place 2 sprays into both nostrils as needed. )  . LORazepam (ATIVAN) 0.5 MG tablet Take 0.5 mg by mouth as needed for anxiety.  . metoprolol succinate (TOPROL-XL) 50 MG 24 hr tablet Take 1 tablet (50 mg total) by mouth daily. Take with or immediately following a meal.  . ranolazine (RANEXA) 500 MG 12 hr tablet  Take 1 tablet (500 mg total) by mouth 2 (two) times daily.  . simvastatin (ZOCOR) 40 MG tablet Take 1 tablet (40 mg total) by mouth at bedtime.  Marland Kitchen. tiZANidine (ZANAFLEX) 4 MG tablet Take 4 mg by mouth as needed for muscle spasms.     Allergies:   Betadine [povidone iodine]   Social History   Socioeconomic History  . Marital status: Married    Spouse name: Not on file  . Number of children: 3  . Years of education: Not on file  . Highest education level: Not on file  Occupational History  . Not on file  Social Needs  . Financial resource strain: Not on file  . Food insecurity:    Worry: Not on file    Inability: Not on file  . Transportation needs:    Medical: Not on file    Non-medical: Not on file  Tobacco Use  . Smoking status: Never Smoker  . Smokeless tobacco: Never Used  Substance and Sexual Activity  . Alcohol use: No    Alcohol/week: 0.0 standard drinks  . Drug use: No  . Sexual activity: Never    Birth control/protection: Post-menopausal  Lifestyle  . Physical activity:    Days per week: Not on file    Minutes per session: Not on file  . Stress: Not on file  Relationships  . Social connections:    Talks on phone: Not on file    Gets together: Not on file    Attends religious service: Not on file    Active member of club or organization: Not on file    Attends meetings of clubs or organizations: Not on file    Relationship status: Not on file  Other Topics Concern  . Not on file  Social History Narrative  . Not on file     Family History: The patient's family history includes Heart disease in her father. There is no history of Anesthesia problems.  ROS:   Please see the history of present illness.     All other systems reviewed and are negative.  EKGs/Labs/Other Studies Reviewed:    The following studies were reviewed today:  2D echo 09/20/17 Study Conclusions - Left ventricle: The cavity size was normal. Wall thickness was increased in a  pattern of mild LVH. The estimated ejection fraction was 55%. Although no diagnostic regional wall motion abnormality was identified, this possibility cannot be completely excluded on the basis of this study. Doppler parameters are consistent with abnormal left ventricular relaxation (grade 1 diastolic dysfunction). - Aortic valve: Trileaflet; severely calcified leaflets. There was moderate stenosis. Mean gradient (S): 29 mm Hg. Peak gradient (S): 44 mm Hg. Valve area (VTI): 1.2 cm^2. - Mitral valve: Moderately calcified annulus. There was no evidence for stenosis. There was mild regurgitation. Mean gradient (D): 3 mm Hg. Valve area by pressure half-time: 2.88 cm^2. - Left atrium: The atrium was severely dilated. - Right ventricle: The cavity size was normal. Systolic function was normal. - Pulmonary  arteries: No complete TR doppler jet so unable to estimate PA systolic pressure. - Inferior vena cava: The vessel was normal in size. The respirophasic diameter changes were in the normal range (>= 50%), consistent with normal central venous pressure.  Impressions: - Normal LV size with mild LV hypertrophy. EF 55%. Normal RV size and systolic function. Moderate aortic stenosis. Mild mitral regurgitation. LAE.   Cardiac Cath 09/22/2017 Conclusions: 1. Severe 3-vessel CAD, as detailed below, with heavy calcification of the coronaries. 2. Diffusely calcified ascending aorta. 3. Moderate aortic stenosis. 4. Mildly elevated left and right heart filling pressures. 5. Normal Fick cardiac output/index.  Recommendations 1. Gentle post-cath hydration today with holding of furosemide today. Restart furosemide tomorrow, as renal function allows. 2. Continue metoprolol; start ranolazine 500 mg BID. 3. There are no feasable percutaneous revascularization options. Cardiac surgery consultation for CABG/AVR could be considered, though given the patient's age and  comorbities, she would be high risk for surgical intervention. Medical therapy may be her best option. I will hold clopidogrel pending CT surgery consultation. If surgery is not pursued, clopidogrel should be restarted.  If patient does not undergo cardiac surgery: Recommend uninterrupted dual antiplatelet therapy with Aspirin 81mg  daily and Clopidogrel 75mg  dailyfor a minimum of 12 months (ACS - Class I recommendation).   EKG:  EKG is not ordered today.  Recent Labs: 09/20/2017: ALT 15; Magnesium 1.8 09/24/2017: B Natriuretic Peptide 904.6; Hemoglobin 12.6; Platelets 171 09/29/2017: BUN 40; Creatinine, Ser 2.11; Potassium 5.0; Sodium 137  Recent Lipid Panel    Component Value Date/Time   CHOL 179 09/24/2017 0439   TRIG 163 (H) 09/24/2017 0439   HDL 58 09/24/2017 0439   CHOLHDL 3.1 09/24/2017 0439   VLDL 33 09/24/2017 0439   LDLCALC 88 09/24/2017 0439    Physical Exam:    VS:  BP 98/65   Pulse 82   Ht 5\' 2"  (1.575 m)   Wt 176 lb (79.8 kg)   BMI 32.19 kg/m     Wt Readings from Last 3 Encounters:  10/05/17 176 lb (79.8 kg)  09/26/17 171 lb 3.2 oz (77.7 kg)  09/23/17 172 lb 9.9 oz (78.3 kg)     GEN: elderly female in wheelchair HEENT: Normal NECK: + JVD CARDIAC: RRR, no murmurs, rubs, gallops RESPIRATORY:  Crackles in bases bilaterally  ABDOMEN: Soft, non-tender, non-distended MUSCULOSKELETAL:  Trace edema; No deformity  SKIN: Warm and dry NEUROLOGIC:  Alert and oriented x 3 PSYCHIATRIC:  Normal affect   ASSESSMENT:    1. Acute on chronic diastolic (congestive) heart failure (HCC)   2. Coronary artery disease involving native coronary artery of native heart without angina pectoris   3. Essential hypertension   4. Moderate aortic stenosis   5. CKD (chronic kidney disease), stage IV (HCC)    PLAN:    In order of problems listed above:  Acute on chronic diastolic (congestive) heart failure (HCC) Coronary artery disease involving native coronary artery of  native heart without angina pectoris - She is doing well on Ranexa, she denies chest pain - Continue beta-blocker, aspirin, Plavix - if she has chest pain, may increase ranexa - avoid nitrates   Essential hypertension - BP marginal today, no medication changes - may decrease toprol if she becomes symptomatic  Moderate aortic stenosis - this may be complicating her SOB and weakness - avoid nitrates  CKD (chronic kidney disease), stage IV (HCC) - Repeat BMP on 09/29/2017 showed creatinine of 2.11, she was instructed to continue holding Lasix -  Baseline creatinine appears to be near 1.5-1.6 - will start low dose lasix 20 mg daily today - case discussed with Dr. Rennis Golden; since we are referring to Hospice, will defer further labs today   Case discussed with Dr. Rennis Golden. Dr. Rennis Golden spoke with the family. The family is requesting Hospice Referral, which was sent. I added lasix as a comfort measure for breathing and swelling. Given her multivessel disease, aortic stenosis, and CKD stage IV, her prognosis is poor. We discussed protein intake for comfort; however, the patient reports having no appetite.    Medication Adjustments/Labs and Tests Ordered: Current medicines are reviewed at length with the patient today.  Concerns regarding medicines are outlined above.  Orders Placed This Encounter  Procedures  . Ambulatory referral to Hospice   Meds ordered this encounter  Medications  . furosemide (LASIX) 20 MG tablet    Sig: Take 1 tablet (20 mg total) by mouth daily.    Dispense:  90 tablet    Refill:  3    Signed, Marcelino Duster, [place]  10/05/2017 10:00 AM    [facility]

## 2017-10-05 ENCOUNTER — Telehealth: Payer: Self-pay

## 2017-10-05 ENCOUNTER — Encounter: Payer: Self-pay | Admitting: Physician Assistant

## 2017-10-05 ENCOUNTER — Ambulatory Visit (INDEPENDENT_AMBULATORY_CARE_PROVIDER_SITE_OTHER): Payer: Medicare Other | Admitting: Physician Assistant

## 2017-10-05 VITALS — BP 98/65 | HR 82 | Ht 62.0 in | Wt 176.0 lb

## 2017-10-05 DIAGNOSIS — I350 Nonrheumatic aortic (valve) stenosis: Secondary | ICD-10-CM

## 2017-10-05 DIAGNOSIS — I5033 Acute on chronic diastolic (congestive) heart failure: Secondary | ICD-10-CM

## 2017-10-05 DIAGNOSIS — N184 Chronic kidney disease, stage 4 (severe): Secondary | ICD-10-CM

## 2017-10-05 DIAGNOSIS — I2510 Atherosclerotic heart disease of native coronary artery without angina pectoris: Secondary | ICD-10-CM | POA: Diagnosis not present

## 2017-10-05 DIAGNOSIS — I10 Essential (primary) hypertension: Secondary | ICD-10-CM | POA: Diagnosis not present

## 2017-10-05 MED ORDER — FUROSEMIDE 20 MG PO TABS: 20.0000 mg | ORAL_TABLET | Freq: Every day | ORAL | 3 refills | Status: AC

## 2017-10-05 NOTE — Patient Instructions (Addendum)
Medication Instructions:  START Lasix 20mg  Take 1 tablet every morning  If you need a refill on your cardiac medications before your next appointment, please call your pharmacy.  Labwork: None   Testing/Procedures: None   Follow-Up: AS NEEDED  Any Other Special Instructions Will Be Listed Below (If Applicable). REFERRAL TO HOSPICE PLACED

## 2017-10-05 NOTE — Telephone Encounter (Signed)
Received Verbal Order for hospice eval from Dr. Rennis GoldenHilty

## 2017-10-06 ENCOUNTER — Telehealth: Payer: Self-pay | Admitting: Physician Assistant

## 2017-10-06 NOTE — Telephone Encounter (Signed)
Returned call to Debbie with Hospice advised to call PCP or attending Dr with Hospice for depression medication.

## 2017-10-06 NOTE — Telephone Encounter (Signed)
[facility] she stated patient's B/P has been low systolic 90's.Stated patient is weak.Advised at 10/05/17 office visit with Micah FlesherAngela Duke PA she advised if patient symptomatic decrease metoprolol.Advised to decrease metoprolol succ to 25 mg daily.Advised to call back if needed.

## 2017-10-06 NOTE — Telephone Encounter (Signed)
New Message     Debbie from [facility] is calling to request something for depression. The family is asking that it does not be Zoloft. Interested in starting Narco as well. Also her BP is running in the med 90's. Not sure if that needs to be addressed or not. Please call to discuss.

## 2017-10-09 ENCOUNTER — Telehealth: Payer: Self-pay | Admitting: Internal Medicine

## 2017-10-09 NOTE — Telephone Encounter (Signed)
[facility] @ [phone]

## 2017-10-11 ENCOUNTER — Telehealth: Payer: Self-pay | Admitting: Internal Medicine

## 2017-10-11 NOTE — Telephone Encounter (Signed)
10/10/17 - [facility] dropped off Death Certificate for Dr.Hilty to sign. Gave D/C to BartoloJenna, Charity fundraiserN.   Received signed D/C back.   10/11/17 - Faxed over a copy of the D/C & called [facility]. [facility] is aware D/C is ready for pick up.  [facility] picked up D/C.

## 2017-10-15 DEATH — deceased

## 2017-10-18 ENCOUNTER — Ambulatory Visit: Payer: Medicare Other | Admitting: Adult Health
